# Patient Record
Sex: Male | Born: 1955
Health system: Southern US, Community
[De-identification: ages and names within clinical notes are randomized; demographics above are authoritative.]

## PROBLEM LIST (undated history)

## (undated) DIAGNOSIS — Z87442 Personal history of urinary calculi: Secondary | ICD-10-CM

## (undated) DIAGNOSIS — R001 Bradycardia, unspecified: Secondary | ICD-10-CM

## (undated) DIAGNOSIS — N200 Calculus of kidney: Secondary | ICD-10-CM

## (undated) DIAGNOSIS — F419 Anxiety disorder, unspecified: Secondary | ICD-10-CM

## (undated) DIAGNOSIS — G43909 Migraine, unspecified, not intractable, without status migrainosus: Secondary | ICD-10-CM

## (undated) DIAGNOSIS — R030 Elevated blood-pressure reading, without diagnosis of hypertension: Secondary | ICD-10-CM

## (undated) DIAGNOSIS — I10 Essential (primary) hypertension: Secondary | ICD-10-CM

## (undated) HISTORY — DX: Calculus of kidney: N20.0

## (undated) HISTORY — DX: Migraine, unspecified, not intractable, without status migrainosus: G43.909

## (undated) HISTORY — DX: Elevated blood-pressure reading, without diagnosis of hypertension: R03.0

## (undated) HISTORY — DX: Bradycardia, unspecified: R00.1

## (undated) HISTORY — DX: Anxiety disorder, unspecified: F41.9

---

## 1990-05-12 DIAGNOSIS — N200 Calculus of kidney: Secondary | ICD-10-CM

## 1990-05-12 HISTORY — DX: Calculus of kidney: N20.0

## 2000-12-14 ENCOUNTER — Emergency Department (HOSPITAL_COMMUNITY): Admission: EM | Admit: 2000-12-14 | Discharge: 2000-12-14 | Payer: Self-pay | Admitting: Emergency Medicine

## 2002-05-08 ENCOUNTER — Encounter: Payer: Self-pay | Admitting: Emergency Medicine

## 2002-05-09 ENCOUNTER — Inpatient Hospital Stay (HOSPITAL_COMMUNITY): Admission: EM | Admit: 2002-05-09 | Discharge: 2002-05-11 | Payer: Self-pay | Admitting: Emergency Medicine

## 2002-05-10 ENCOUNTER — Encounter: Payer: Self-pay | Admitting: Urology

## 2002-05-11 ENCOUNTER — Encounter: Payer: Self-pay | Admitting: Urology

## 2002-05-12 HISTORY — PX: URETEROSCOPY: SHX842

## 2004-05-12 HISTORY — PX: COLONOSCOPY: SHX174

## 2005-04-15 ENCOUNTER — Ambulatory Visit: Payer: Self-pay | Admitting: Internal Medicine

## 2005-04-18 ENCOUNTER — Ambulatory Visit (HOSPITAL_COMMUNITY): Admission: RE | Admit: 2005-04-18 | Discharge: 2005-04-18 | Payer: Self-pay | Admitting: Internal Medicine

## 2005-04-23 ENCOUNTER — Ambulatory Visit (HOSPITAL_COMMUNITY): Admission: RE | Admit: 2005-04-23 | Discharge: 2005-04-23 | Payer: Self-pay | Admitting: Family Medicine

## 2005-04-28 ENCOUNTER — Encounter (HOSPITAL_COMMUNITY): Admission: RE | Admit: 2005-04-28 | Discharge: 2005-05-06 | Payer: Self-pay | Admitting: Family Medicine

## 2005-05-13 ENCOUNTER — Encounter (HOSPITAL_COMMUNITY): Admission: RE | Admit: 2005-05-13 | Discharge: 2005-06-12 | Payer: Self-pay | Admitting: Family Medicine

## 2005-05-16 ENCOUNTER — Ambulatory Visit (HOSPITAL_COMMUNITY): Admission: RE | Admit: 2005-05-16 | Discharge: 2005-05-16 | Payer: Self-pay | Admitting: Family Medicine

## 2005-05-19 ENCOUNTER — Ambulatory Visit (HOSPITAL_COMMUNITY): Admission: RE | Admit: 2005-05-19 | Discharge: 2005-05-20 | Payer: Self-pay | Admitting: Family Medicine

## 2005-05-21 ENCOUNTER — Ambulatory Visit: Payer: Self-pay | Admitting: *Deleted

## 2005-07-12 ENCOUNTER — Emergency Department (HOSPITAL_COMMUNITY): Admission: EM | Admit: 2005-07-12 | Discharge: 2005-07-12 | Payer: Self-pay | Admitting: Emergency Medicine

## 2006-11-02 ENCOUNTER — Emergency Department (HOSPITAL_COMMUNITY): Admission: EM | Admit: 2006-11-02 | Discharge: 2006-11-02 | Payer: Self-pay | Admitting: Emergency Medicine

## 2010-05-12 DIAGNOSIS — R030 Elevated blood-pressure reading, without diagnosis of hypertension: Secondary | ICD-10-CM

## 2010-05-12 DIAGNOSIS — R001 Bradycardia, unspecified: Secondary | ICD-10-CM

## 2010-05-12 HISTORY — DX: Elevated blood-pressure reading, without diagnosis of hypertension: R03.0

## 2010-05-12 HISTORY — DX: Bradycardia, unspecified: R00.1

## 2010-09-27 NOTE — Op Note (Signed)
NAME:  Shaun Potter, Shaun Potter           ACCOUNT NO.:  [id number]   MEDICAL RECORD NO.:  [id number]          PATIENT TYPE:  AMB   LOCATION:  DAY                           FACILITY:  [facility]   PHYSICIAN:  R. Roetta Sessions, M.D. DATE OF BIRTH:  07-Nov-1955   DATE OF PROCEDURE:  04/18/2005  DATE OF DISCHARGE:                                 OPERATIVE REPORT   PROCEDURE:  Screening colonoscopy.   INDICATIONS FOR PROCEDURE:  The patient is a 55 year old gentleman sent over  at the courtesy of Dr. Milinda Cave for colorectal cancer screening. He is devoid  of any lower GI tract symptoms. No family history of colorectal neoplasia.  He has never had his lower GI tract imaged. Colonoscopy is now being done as  a screening maneuver. This approach has been discussed with the patient at  length. Potential risks, benefits, and alternatives have been reviewed and  questions answered. He is agreeable. Please see documentation in the medical  record.   PROCEDURE NOTE:  O2 saturation, blood pressure, pulse, and respirations were  monitored throughout the entire procedure. Conscious sedation with Versed 3  mg IV and Demerol 25 mg IV in divided doses.   INSTRUMENT:  Olympus video chip system.   FINDINGS:  Digital rectal exam revealed no abnormalities.   ENDOSCOPIC FINDINGS:  Prep was adequate.   Rectum:  Examination of the rectal mucosa including retroflexed view of the  anal verge revealed anal papilla and internal hemorrhoids. Otherwise rectal  mucosa appeared normal.   Colon:  Colonic mucosa was surveyed from the rectosigmoid junction through  the left, transverse, and right colon to the area of the appendiceal  orifice, ileocecal valve, and cecum. These structures were well seen and  photographed for the record. From this level, the scope was slowly  withdrawn, and all previously mentioned mucosal surfaces were again seen.  The colonic mucosa appeared entirely normal. The patient tolerated the  procedure  well and was reactive to endoscopy.   IMPRESSION:  1.  Anal papilla and internal hemorrhoids. Otherwise normal rectum.  2.  Normal colon.   RECOMMENDATIONS:  Repeat colonoscopy in 10 years.      Jonathon Bellows, M.D.  Electronically Signed     RMR/MEDQ  D:  04/18/2005  T:  04/18/2005  Job:  [id number]   cc:   Jeoffrey Massed, MD  Fax: [phone]

## 2010-09-27 NOTE — Op Note (Signed)
NAME:  JACKIE, LITTLEJOHN NO.:  [id number]   MEDICAL RECORD NO.:  [id number]                   PATIENT TYPE:  INP   LOCATION:  A311                                 FACILITY:  [facility]   PHYSICIAN:  Dennie Maizes, M.D.                DATE OF BIRTH:  [birth year]   DATE OF PROCEDURE:  05/11/2002  DATE OF DISCHARGE:                                 OPERATIVE REPORT   PREOPERATIVE DIAGNOSES:  Left distal ureteral calculus with obstruction,  left hydronephrosis, left renal colic.   POSTOPERATIVE DIAGNOSES:  Left distal ureteral calculus with obstruction,  left hydronephrosis, left renal colic.   PROCEDURE:  Cystoscopy, left retrograde pyelogram, ureteroscopy, stone  extraction and left ureteral stent placement.   ANESTHESIA:  General.   SURGEON:  Dennie Maizes, M.D.   COMPLICATIONS:  None.   INDICATIONS FOR PROCEDURE:  This 55 year old male was admitted to the  hospital with severe left renal colic. Evaluation revealed a 5 mm size left  distal ureteral calculus with obstruction and hydronephrosis. The patient  had persistent pain and he was unable to pass the stone. He was taken to the  operating room today for cystoscopy, left retrograde pyelogram,  ureteroscopy, stone extraction and ureteral  stent placement.   DESCRIPTION OF PROCEDURE:  General anesthesia was induced and the patient  was placed on the OR table in the dorsal lithotomy position. The lower  abdomen and genitalia were prepped and draped in a sterile fashion.  Cystoscopy was done with a 25 [place] scope. The urethra and prostate were  normal. The bladder was then examined. There was _______ edema and erythema  around the left ureteral orifice at the prominence of the entire middle  ureter. There was distortion and medial displacement of the ureteral  orifice. A 5 French _________ catheter could not be inserted into the  ureteral orifice. I was unable to pass a Benson guidewire into the  ureter.  Through a 5 [place] open ended catheter, an angled zebra guidewire was then  inserted with some difficulty into the renal pelvis. The distal ureter was  then dilated using an 31 French balloon dilating catheter. The balloon  dilating catheter was then removed leaving the guidewire in place. The 5  [place] open ended catheter was then placed over the guidewire and the  contrast was injected to see the collecting system. The [facility] guidewire was  then inserted through the open ended catheter and the open ended catheter  was removed.   Ureteroscopy was done with a rigid 9.5 French ureteroscope. During this  procedure, a 5 mm stone fell out of the distal ureter into the bladder. This  was irrigated and taken out. Examination of the ureter was done up to the  pelvic  brim and no other stone was seen. The instruments were then removed. A 6  French 26 cm size stent with a string was then inserted into the left  collecting system. The patient tolerated the procedure well. He was  transferred to the PACU in a satisfactory condition.                                               Dennie Maizes, M.D.    SK/MEDQ  D:  05/11/2002  T:  05/11/2002  Job:  [id number]

## 2010-09-27 NOTE — Discharge Summary (Signed)
NAME:  Shaun Potter, Shaun Potter NO.:  [id number]   MEDICAL RECORD NO.:  [id number]                   PATIENT TYPE:  INP   LOCATION:  A311                                 FACILITY:  [facility]   PHYSICIAN:  Dennie Maizes, M.D.                DATE OF BIRTH:  Jul 17, 1955   DATE OF ADMISSION:  05/08/2002  DATE OF DISCHARGE:  05/11/2002                                 DISCHARGE SUMMARY   FINAL DIAGNOSES:  1. Left distal ureteral calculus with obstruction.  2. Left hydronephrosis.  3. Left renal colic.  4. Hypokalemia.   OPERATIVE PROCEDURE:  Cystoscopy, left retrograde pyelogram, ureteroscopy,  stone extraction and left ureteral stent placement done on May 11, 2002.   COMPLICATIONS:  None.   DISCHARGE SUMMARY:  This 55 year old male has a past history of  urolithiasis.  He has passed a stone in 10-14-1990.  Experienced sudden onset of  severe left flank pain radiating to the front since 6 p.m. on May 07, 2002.  He came to emergency room at [facility] for further  evaluation.  Microscopic hematuria was noted.  He was evaluated with a  noncontrast spiral CT scan of the abdomen and pelvis.  This revealed a 4.5  mm sized left ureteral calculus at the level of the uterosacral junction  with high grade obstruction.  The patient's pain was not adequately relieved  in the emergency room.  He was admitted to the hospital for pain control, IV  fluids, further evaluation, and treatment.   PAST MEDICAL HISTORY:  Urolithiasis in 101.  No medical illnesses.   MEDICATIONS:  None.   OPERATIONS:  None.   ALLERGIES:  None.   FAMILY HISTORY:  Positive for urolithiasis.   PHYSICAL EXAMINATION:  GENERAL:  The patient was comfortable after receiving  PCA analgesia.  HEENT:  Normal.  NECK:  No masses.  LUNGS:  Clear to auscultation.  HEART:  Regular rate and rhythm.  No murmurs.  ABDOMEN:  Soft.  No palpable flank mass.  Mild upper costovertebral angle  tenderness was noted.  Bladder not palpable.  GENITOURINARY:  Penis normal.  Testes normal.  RECTAL:  30 g sized benign prostate.   ADMISSION LABORATORIES:  Urinalysis:  Large blood, 2+ ketones.  CBC:  WBC  8.1, hemoglobin 13.6.  Sodium 138, potassium 3.3, BUN 22, creatinine 1.3.   HOSPITAL COURSE:  The patient was admitted to the hospital and treated with  IV fluids and potassium supplements.  He received parenteral narcotics.  He  continued to have severe intermittent left flank pain.  An x-ray of the area  done on May 10, 2002, revealed a 5 mm sized left distal ureteral  calculus.  The patient was unable to pass the stone.  After consulting he  was taken to the OR on May 11, 2002.  Cystoscopy, left retrograde  pyelogram, ureteroscopy, stone extraction, and left ureteral stent placement  were  done.  The patient had adequate pain relief after this.  He was  discharged home in the postoperative period.   DISCHARGE MEDICATIONS:  1. Amoxicillin 500 mg one p.o. t.i.d. for seven days.  2. Percocet 5/325 one p.o. q.8h. p.r.n. pain number 20.   FOLLOW UP:  He will return to the office on May 16, 2002 for follow-up  and stent removal.  He was advised to call me for fever, chills, severe  pain, or voiding problems.                                               Dennie Maizes, M.D.    SK/MEDQ  D:  07/28/2002  T:  07/28/2002  Job:  [id number]

## 2010-09-27 NOTE — H&P (Signed)
NAME:  Shaun Potter, Shaun Potter NO.:  [id number]   MEDICAL RECORD NO.:  [id number]                   PATIENT TYPE:  INP   LOCATION:  A311                                 FACILITY:  [facility]   PHYSICIAN:  Dennie Maizes, M.D.                DATE OF BIRTH:  07-Sep-1955   DATE OF ADMISSION:  05/08/2002  DATE OF DISCHARGE:                                HISTORY & PHYSICAL   CHIEF COMPLAINT:  Severe left flank pain radiating to the front.   HISTORY OF PRESENT ILLNESS:  This 55 year old male has a past history of  urolithiasis.  He has passed a kidney stone in 1990/10/18.  He experienced sudden  onset of severe left flank pain radiating to the front since 6 p.m.  yesterday.  He came to the emergency room for further evaluation.  Microscopic hematuria was noted.  He was evaluated with a noncontrast spiral  CT scan of the abdomen and pelvis.  This revealed a 4.5 mm sized left  vesicoureteral calculus at the level of the ureterovesicular junction with  high grade obstruction.  The patient's pain was not adequately relieved in  the emergency room.  He has been admitted to the hospital for pain control,  IV fluids, further evaluation and treatment.   PAST MEDICAL HISTORY:  Urolithiasis in 35.  No medical illnesses.   MEDICATIONS:  None.   PAST SURGICAL HISTORY:  None.   ALLERGIES:  None.   FAMILY HISTORY:  Positive for urolithiasis.   PHYSICAL EXAMINATION:  GENERAL:  The patient is comfortable with PCA pump.  HEENT:  Normal.  NECK:  No masses.  LUNGS:  Clear to auscultation.  HEART:  Regular rate and rhythm.  No murmurs.  ABDOMEN:  Soft.  No palpable flank mass.  Mild left CVA tenderness is noted.  Bladder not palpable.  GENITOURINARY:  Penis normal.  Testes normal.  RECTAL:  ___________ gram benign prostate.   ADMISSION LABORATORIES:  Urinalysis:  Large blood, 2+ ketones.  CBC:  WBC  8.1, hemoglobin 13.6.  Sodium 138, potassium 3.3.  BUN 22, creatinine 1.3.   IMPRESSION:  1. Left distal ureteral calculus with obstruction.  2. Left hydronephrosis.  3. Left renal colic.  4. Hypokalemia.   PLAN:  1. Admit patient to the hospital.  2. PCA for analgesia.  3. Strain all urine for stones.  4. IV fluids.  5. Discussed with the patient regarding management options.  If he has     persistent pain and if he is unable to pass the stone, will do     cystoscopy, left retrograde pyelogram, and ureteroscopy stone extraction.                                               Dennie Maizes, M.D.    SK/MEDQ  D:  05/09/2002  T:  05/09/2002  Job:  [id number]   cc:   Mila Homer. Sudie Bailey, M.D.  [address] [place], [place] [address]  Fax: [phone]

## 2010-11-05 ENCOUNTER — Observation Stay (HOSPITAL_COMMUNITY)
Admission: EM | Admit: 2010-11-05 | Discharge: 2010-11-06 | Disposition: A | Discharge status: Home / Self Care | Payer: BC Managed Care – PPO | Attending: Internal Medicine | Admitting: Internal Medicine

## 2010-11-05 ENCOUNTER — Emergency Department (HOSPITAL_COMMUNITY): Payer: BC Managed Care – PPO

## 2010-11-05 DIAGNOSIS — R002 Palpitations: Secondary | ICD-10-CM

## 2010-11-05 DIAGNOSIS — R0789 Other chest pain: Principal | ICD-10-CM | POA: Insufficient documentation

## 2010-11-05 DIAGNOSIS — N289 Disorder of kidney and ureter, unspecified: Secondary | ICD-10-CM | POA: Insufficient documentation

## 2010-11-05 DIAGNOSIS — R0602 Shortness of breath: Secondary | ICD-10-CM

## 2010-11-05 DIAGNOSIS — R030 Elevated blood-pressure reading, without diagnosis of hypertension: Secondary | ICD-10-CM | POA: Insufficient documentation

## 2010-11-05 LAB — CK TOTAL AND CKMB (NOT AT ARMC)
CK, MB: 3.9 ng/mL (ref 0.3–4.0)
Total CK: 138 U/L (ref 7–232)

## 2010-11-05 LAB — DIFFERENTIAL
Basophils Absolute: 0 10*3/uL (ref 0.0–0.1)
Neutrophils Relative %: 44 % (ref 43–77)

## 2010-11-05 LAB — BASIC METABOLIC PANEL
Calcium: 9.1 mg/dL (ref 8.4–10.5)
GFR calc Af Amer: >60 mL/min (ref >60)

## 2010-11-05 LAB — CARDIAC PANEL(CRET KIN+CKTOT+MB+TROPI)
CK, MB: 3.5 ng/mL (ref 0.3–4.0)
Relative Index: 2.7 — ABNORMAL HIGH (ref 0.0–2.5)
Total CK: 122 U/L (ref 7–232)
Troponin I: <0.3 ng/mL (ref <0.30)

## 2010-11-05 LAB — CBC
Hemoglobin: 14.5 g/dL (ref 13.0–17.0)
MCH: 31 pg (ref 26.0–34.0)
Platelets: 179 10*3/uL (ref 150–400)
RDW: 12.5 % (ref 11.5–15.5)
WBC: 4.8 10*3/uL (ref 4.0–10.5)

## 2010-11-05 LAB — TROPONIN I: Troponin I: <0.3 ng/mL (ref <0.30)

## 2010-11-05 NOTE — H&P (Signed)
NAME:  Shaun Potter, Shaun Potter NO.:  [id number]  MEDICAL RECORD NO.:  [id number]  LOCATION:  APED                          FACILITY:  [facility]  PHYSICIAN:  Wilson Singer, M.D.DATE OF BIRTH:  Apr 06, 1956  DATE OF ADMISSION:  11/05/2010 DATE OF DISCHARGE:  LH                             HISTORY & PHYSICAL   PRIMARY CARE PHYSICIAN:  Francoise Schaumann. Halm, DO, FAAP  CHIEF COMPLAINT:  Chest pain.  HISTORY OF PRESENT ILLNESS:  This is a very pleasant 56 year old man describes approximately two-day history of intermittent chest tightness lasting approximately 2 minutes every time associated with tingling of his fingers and around his mouth.  He also feels he has to take a deep breath from time-to-time.  Exercise such as going upstairs actually improves his symptoms.  He was worried about his heart and therefore came to the emergency room upon which he was referred to [facility] for admission in view of the concern over cardiac chest pain.  The patient has no family history of early coronary artery disease, no history of hypertension, no history of diabetes, and no history of hyperlipidemia.  He is a nonsmoker.  PAST MEDICAL HISTORY:  Kidney stone removal and also migraine for which he takes Maxalt p.r.n.  SOCIAL HISTORY:  He has been married for 21 years.  He does not smoke, does not drink alcohol.  He works as an Lexicographer.  MEDICATIONS:  Maxalt 10 mg p.r.n. for migraine.  ALLERGIES:  None.  REVIEW OF SYSTEMS:  Apart from the symptoms mentioned above, there are no other symptoms referable to all systems reviewed.  He did tell me that he has been worried for the last month about a car that he is having restored and this car is in [place].  He keeps thinking about this most days and he is concerned that the car is not being restored in timely manner.  Everyday that the car is in the garage in [place], he has to pay 50 dollars per day.  PHYSICAL  EXAMINATION:  VITAL SIGNS:  Temperature 97.7, blood pressure 131/92, pulse 64, saturation 98% on room air. GENERAL:  He looks systemically well and does not look in acute pain. He is not clubbed.  There is no jaundice.  There is no clinical anemia. CARDIOVASCULAR:  Heart sounds present and normal. LUNGS:  Lung fields are entirely clear. ABDOMEN:  Soft and nontender with no evidence of hepatosplenomegaly. NEUROLOGIC:  He is alert and oriented without any focal neurologic signs. SKIN:  There are no obvious skin lesions or rashes.  INVESTIGATIONS:  Electrocardiogram shows normal sinus rhythm without acute ST-T wave changes and essentially is a normal electrocardiogram. Lab work shows a hemoglobin of 14.5, white blood cell count 4.8, platelets 179.  Sodium 140, potassium 3.9, bicarbonate 27, BUN 16, creatinine 1.37.  Initial cardiac enzymes negative.  Chest x-ray in no active disease.  PROBLEM LIST: 1. Chest pain/tightness, possibly related to hyperventilation and     anxiety. 2. Hyperventilation/anxiety.  PLAN:  1,  Admit to telemetry. 1. Serial cardiac enzymes and ECGs. 2. Cardiology consultation with a view to a stress test.  Further recommendations will depend on the patient's hospital progress.  Wilson Singer, M.D.     NCG/MEDQ  D:  11/05/2010  T:  11/05/2010  Job:  [id number]  cc:   Francoise Schaumann. Raynelle Highland Fax: [phone]  Electronically Signed by Lilly Cove M.D. on 11/05/2010 02:25:40 PM

## 2010-11-06 DIAGNOSIS — R072 Precordial pain: Secondary | ICD-10-CM

## 2010-11-06 LAB — CBC
MCHC: 33.9 g/dL (ref 30.0–36.0)
MCV: 91.3 fL (ref 78.0–100.0)
WBC: 5 10*3/uL (ref 4.0–10.5)

## 2010-11-06 LAB — COMPREHENSIVE METABOLIC PANEL
ALT: 12 U/L (ref 0–53)
BUN: 15 mg/dL (ref 6–23)
Calcium: 9 mg/dL (ref 8.4–10.5)
Creatinine, Ser: 1.48 mg/dL — ABNORMAL HIGH (ref 0.50–1.35)
GFR calc Af Amer: 59 mL/min — ABNORMAL LOW (ref >60)
GFR calc non Af Amer: 49 mL/min — ABNORMAL LOW (ref >60)
Potassium: 3.8 mEq/L (ref 3.5–5.1)
Total Bilirubin: 0.3 mg/dL (ref 0.3–1.2)

## 2010-11-06 LAB — DIFFERENTIAL
Basophils Absolute: 0 10*3/uL (ref 0.0–0.1)
Basophils Relative: 1 % (ref 0–1)
Eosinophils Relative: 3 % (ref 0–5)
Lymphocytes Relative: 39 % (ref 12–46)
Lymphs Abs: 2 10*3/uL (ref 0.7–4.0)
Monocytes Absolute: 0.3 10*3/uL (ref 0.1–1.0)
Neutro Abs: 2.5 10*3/uL (ref 1.7–7.7)

## 2010-11-12 NOTE — Consult Note (Signed)
NAME:  MACKIE, HOLNESS NO.:  [id number]  MEDICAL RECORD NO.:  [id number]  LOCATION:  A305                          FACILITY:  [facility]  PHYSICIAN:  Jesse Sans. Yaeli Hartung, MD, FACCDATE OF BIRTH:  Jun 06, 1955  DATE OF CONSULTATION: DATE OF DISCHARGE:                                CONSULTATION   I was asked by Dr. Karilyn Cota of the [facility] to consult on Kaidan Harpster with some irregular heartbeat and some shortness of breath and palpitations and chest discomfort.  HISTORY OF PRESENT ILLNESS:  Mr. Jethro Poling is a very active, healthy 55-year-old married white male who was admitted this morning with the above complaint.  He was playing golf at [employer] on Friday.  He drank 3 large cups of water and then felt a discomfort in his chest.  He then checked his pulse with his wrist monitor which was irregular.  He continued to have the irregularity that evening.  He felt bad the next day and decided to come home.  He took it easy over the weekend.  On Monday he felt pretty good.  This morning at work he felt a bit anxious and felt some tingling around his mouth and the ends of his fingers.  He fell little short of breath.  He was admitted to the emergency room.  His initial EKG remarkably showed sinus bradycardia rate 55 with no acute changes.  First set of markers are negative.  His cardiac risk factors are minimal being age only.  He has noted a slight increase in his blood pressure over the last 3 weeks and he was hypertensive in the ED.  PAST MEDICAL HISTORY:  He has a history of migraine headaches and takes Maxalt frequently.  In addition, he has this questionable new-onset hypertension.  There is no history of hyperlipidemia.  No diabetes.  He does not smoke, does not drink.  MEDICATIONS AT HOME: 1. Advil 400 mg as needed. 2. Maxalt 10 mg as needed.  ALLERGIES:  He has no known drug allergies.  PAST SURGICAL HISTORY:  Colonoscopy.  Looking  back through his records, he did have a Holter monitor put on in January 2007 which showed a slow heart rate while asleep but no ventricular ectopy, no atrial fib, no other dysrhythmias.  SOCIAL HISTORY:  Lives in [place].  He is married.  He has no children.  He is an Psychologist, forensic.  He enjoys multiple sports including basketball and softball.  He stays in good shape.  He has had no exertional symptoms by the way.  FAMILY HISTORY:  Remarkable for his father who has irregular heartbeat which has "never been diagnosed"  There is no premature coronary artery disease in his family.  REVIEW OF SYSTEMS:  Other than HPI is all negative.  PHYSICAL EXAMINATION:  GENERAL:  He is remarkably healthy-looking gentleman in no acute distress. VITAL SIGNS:  Blood pressure 149/90, his pulse is 53 and regular, his respirations are 18, his temperature is 97.6, sats 96% on room air. HEENT:  Symmetrical facies.  PERRLA.  Extraocular movements intact. Sclerae are clear.  Dentition satisfactory. NECK:  Supple.  No carotid bruits.  Thyroid is not enlarged.  Trachea is midline.  No adenopathy. LUNGS:  Clear to auscultation and percussion HEART:  A nondisplaced PMI.  Slow regular rate and rhythm.  S2 splits. No murmurs, no click. ABDOMEN:  Soft, good bowel sounds.  No midline bruits. EXTREMITIES:  No cyanosis, clubbing or edema.  Pulses are intact. NEURO:  Intact. PSYCHIATRIC:  Normal affect. SKIN:  Unremarkable.  LABORATORY DATA:  Chest x-ray and EKG reviewed.  ASSESSMENT:  Noncoronary chest pain.  I suspect this was cold water- induced esophageal spasm, perhaps associated with a feeling of some irregularity which he notes on a regular basis though it has never been found to be of any substantial objectively.  He also had some hyperventilation today with some anxiety.  He clearly has some new onset hypertension.  RECOMMENDATIONS: 1. Check cardiac enzymes.  If negative, no further  cardiac workup. 2. A 2-D echocardiogram to rule out any organic heart disease.  If it     is normal, it will make him feel a lot better. 3. Decrease caffeine 4. Increase exercise to reduce his stress. 5. I would treat his hypertension with an ARB or ACE generic with and     without diuretic.  He has a significant     resting bradycardia and gets very bradycardic with sleep.  I would     avoid a beta-blocker or a calcium channel blocker.  Thank you very much for the consultation.     Kareem Cathey C. Daleen Squibb, MD, [facility]     TCW/MEDQ  D:  11/05/2010  T:  11/06/2010  Job:  [id number]  Electronically Signed by Valera Castle MD [facility] on 11/12/2010 10:36:28 AM

## 2010-11-14 NOTE — Discharge Summary (Signed)
  NAME:  Shaun Potter, Shaun Potter NO.:  [id number]  MEDICAL RECORD NO.:  [id number]  LOCATION:  A305                          FACILITY:  [facility]  PHYSICIAN:  Creedon Danielski L. Lendell Potter, MDDATE OF BIRTH:  10/11/1955  DATE OF ADMISSION:  11/05/2010 DATE OF DISCHARGE:  06/27/2012LH                              DISCHARGE SUMMARY   DISCHARGE DIAGNOSES: 1. Chest pain, myocardial infarction ruled out. 2. Elevated blood pressure without history of documented hypertension. 3. History of migraines. 4. Renal insufficiency.  DISCHARGE MEDICATIONS: 1. Tylenol 650 mg p.o. q.4 h. p.r.n. pain. 2. Maxalt 10 mg as needed for migraine. 3. Ibuprofen 400 mg p.o. q.6 h. p.r.n. pain.  CONDITION:  Stable.  ACTIVITY:  Ad lib.  DIET:  Low salt.  FOLLOWUP:  With Dr. Milford Cage for blood pressure check and monitor creatinine.  CONSULTATIONS:  [facility].  PROCEDURES:  None.  LABORATORY DATA:  Serial cardiac enzymes negative.  CBC normal.  Basic metabolic panel significant for a creatinine of 1.37.  Chest x-ray showed nothing acute.  Echocardiogram showed normal ejection fraction, grade 1 diastolic dysfunction, no wall motion abnormalities.  EKG showed normal sinus rhythm.  HISTORY AND HOSPITAL COURSE:  Please see H and P for complete admission details.  Shaun Potter is a pleasant, active 55 year old white male, who presented with chest discomfort and palpitations.  It occurred after drinking liquids.  He had a normal EKG and cardiac enzymes, but was placed on observation on the Hospitalist Service.  Cardiology was consulted and recommended an echocardiogram, but no further workup.  His echocardiogram shows good ejection fraction and normal wall motion.  He has been cleared for discharge.  If his symptoms continue, Dr. Diona Browner recommends an outpatient stress echocardiogram, but no further workup at this time.  The patient has had mildly elevated blood pressures ranging 120-150/80- 90.   He has a blood pressure monitor at home and reports that he usually runs about 125-130/74 or so.  He also had a bit of renal insufficiency. He can follow up with his primary care physician to monitor blood pressure and renal function.     Shaun Thurman L. Lendell Caprice, MD     CLS/MEDQ  D:  11/06/2010  T:  11/07/2010  Job:  [id number]  cc:   Francoise Schaumann. Benjamine Mola, [place] Fax: [phone]  Electronically Signed by Crista Curb MD on 11/14/2010 07:15:51 PM

## 2011-02-26 LAB — CBC
MCV: 84.7
RBC: 4.74

## 2011-02-26 LAB — COMPREHENSIVE METABOLIC PANEL
AST: 25
BUN: 22
CO2: 23
Calcium: 9.7
Chloride: 108
Creatinine, Ser: 1.44
Glucose, Bld: 124 — ABNORMAL HIGH
Potassium: 3.6
Sodium: 140
Total Protein: 7.2

## 2011-02-26 LAB — DIFFERENTIAL
Basophils Absolute: 0
Basophils Relative: 0
Eosinophils Absolute: 0
Eosinophils Relative: 0
Lymphocytes Relative: 10 — ABNORMAL LOW
Monocytes Absolute: 0.4
Monocytes Relative: 6

## 2011-02-26 LAB — URINALYSIS, ROUTINE W REFLEX MICROSCOPIC
Glucose, UA: NEGATIVE
Nitrite: NEGATIVE
Protein, ur: NEGATIVE

## 2011-02-26 LAB — LIPASE, BLOOD: Lipase: 15

## 2011-05-27 ENCOUNTER — Ambulatory Visit: Payer: BC Managed Care – PPO | Admitting: Cardiology

## 2011-05-28 ENCOUNTER — Ambulatory Visit: Payer: BC Managed Care – PPO | Admitting: Cardiology

## 2011-06-06 ENCOUNTER — Encounter: Payer: Self-pay | Admitting: Cardiology

## 2011-06-06 ENCOUNTER — Ambulatory Visit: Payer: BC Managed Care – PPO | Admitting: Cardiology

## 2011-06-06 DIAGNOSIS — R079 Chest pain, unspecified: Secondary | ICD-10-CM | POA: Insufficient documentation

## 2011-06-06 DIAGNOSIS — N200 Calculus of kidney: Secondary | ICD-10-CM | POA: Insufficient documentation

## 2011-06-06 DIAGNOSIS — G43909 Migraine, unspecified, not intractable, without status migrainosus: Secondary | ICD-10-CM | POA: Insufficient documentation

## 2012-06-04 IMAGING — CR DG CHEST 1V PORT
1 series · 1 of 1 positions shown · non-contrast
Comparison: 07/12/2005

CLINICAL DATA: Chest pain and short of breath.  Palpitations.

PORTABLE CHEST - 1 VIEW

[view not recorded]
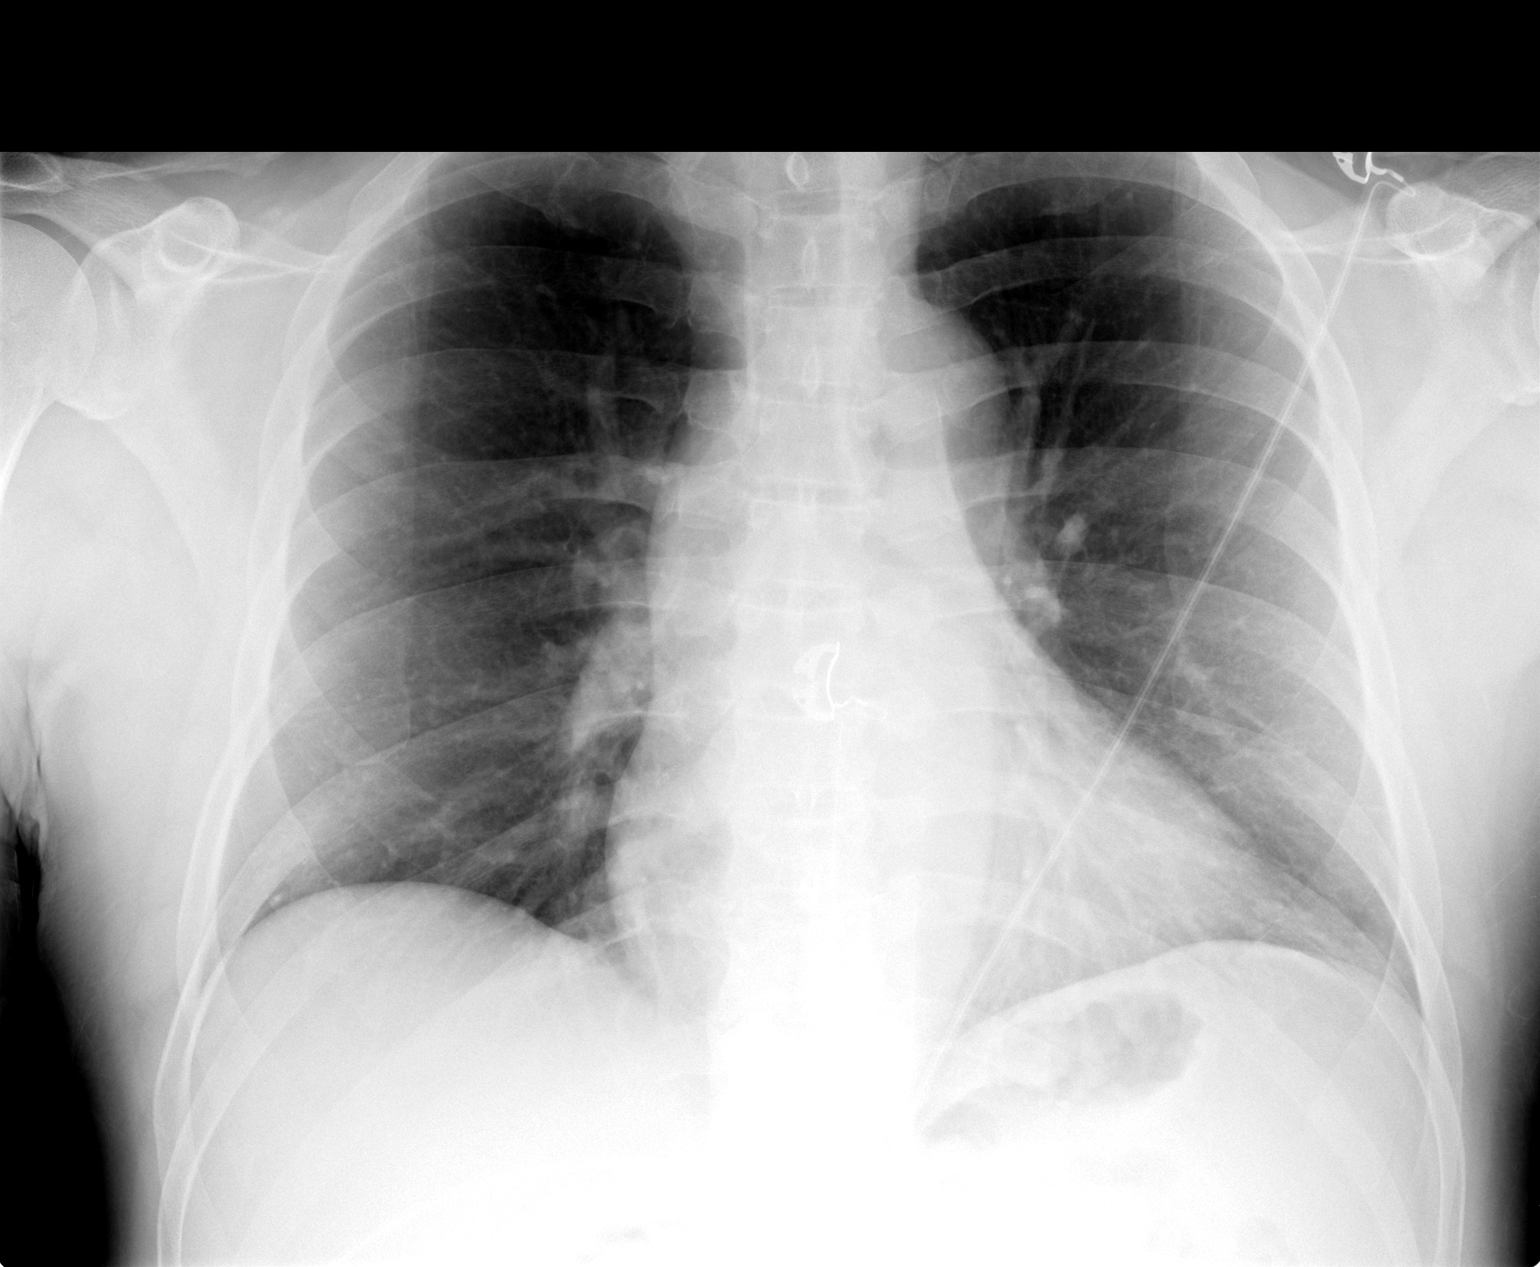

[1 of 1 positions shown; findings below may reference images not displayed]

FINDINGS: Heart size is within normal limits.  Both lungs are
clear.  No evidence of pleural effusion.  No mass or adenopathy
identified.
IMPRESSION: No active disease.

## 2012-10-29 ENCOUNTER — Encounter (HOSPITAL_COMMUNITY): Payer: Self-pay | Admitting: Pharmacy Technician

## 2012-11-03 NOTE — Patient Instructions (Addendum)
Shaun Potter  11/03/2012   Your procedure is scheduled on:  11/10/2012  Report to [facility] at  615  AM.  Call this number if you have problems the morning of surgery: [phone]   Remember:   Do not eat food or drink liquids after midnight.   Take these medicines the morning of surgery with A SIP OF WATER: benicar   Do not wear jewelry, make-up or nail polish.  Do not wear lotions, powders, or perfumes.   Do not shave 48 hours prior to surgery. Men may shave face and neck.  Do not bring valuables to the hospital.  [facility] is not responsible for any belongings or valuables.  Contacts, dentures or bridgework may not be worn into surgery.  Leave suitcase in the car. After surgery it may be brought to your room.  For patients admitted to the hospital, checkout time is 11:00 AM the day of discharge.   Patients discharged the day of surgery will not be allowed to drive home.  Name and phone number of your driver: family  Special Instructions: Shower using CHG 2 nights before surgery and the night before surgery.  If you shower the day of surgery use CHG.  Use special wash - you have one bottle of CHG for all showers.  You should use approximately 1/3 of the bottle for each shower.   Please read over the following fact sheets that you were given: Pain Booklet, Coughing and Deep Breathing, MRSA Information and Surgical Site Infection Prevention Cyst Removal Your caregiver has removed a cyst. A cyst is a sac containing a semi-solid material. Cysts may occur any place on your body. They may remain small for years or gradually get larger. A sebaceous cyst is an enlarged (dilated) sweat gland filled with old sweat (sebum). Unattended, these may become large (the size of a softball) over several years time. These are often removed for improved appearance (cosmetic) reasons or before they become infected to form an abscess. An abscess is an infected cyst. HOME CARE INSTRUCTIONS   Keep  your bandage clean and dry. You may change your bandage after 24 hours. If your bandage sticks, use warm water to gently loosen it. Pat the area dry with a clean towel before putting on another bandage.  If possible, keep the area where the cyst was removed raised to relieve soreness, swelling, and promote healing.  If you have stitches, keep them clean and dry.  You may clean your stitches gently with a cotton swab dipped in warm soapy water.  Do not soak the area where the cyst was removed or go swimming. You may shower.  Do not overuse the area where your cyst was removed.  Return in 7 days or as directed to have your stitches removed.  Take medicines as instructed by your caregiver. SEEK IMMEDIATE MEDICAL CARE IF:   An oral temperature above 102 F (38.9 C) develops, not controlled by medication.  Blood continues to soak through the bandage.  You have increasing pain in the area where your cyst was removed.  You have redness, swelling, pus, a bad smell, soreness (inflammation), or red streaks coming away from the stitches. These are signs of infection. MAKE SURE YOU:   Understand these instructions.  Will watch your condition.  Will get help right away if you are not doing well or get worse. Document Released: 04/25/2000 Document Revised: 07/21/2011 Document Reviewed: 08/19/2007 [facility] Patient Information 2014 [place], [place]. PATIENT  INSTRUCTIONS POST-ANESTHESIA  IMMEDIATELY FOLLOWING SURGERY:  Do not drive or operate machinery for the first twenty four hours after surgery.  Do not make any important decisions for twenty four hours after surgery or while taking narcotic pain medications or sedatives.  If you develop intractable nausea and vomiting or a severe headache please notify your doctor immediately.  FOLLOW-UP:  Please make an appointment with your surgeon as instructed. You do not need to follow up with anesthesia unless specifically instructed to do so.  WOUND  CARE INSTRUCTIONS (if applicable):  Keep a dry clean dressing on the anesthesia/puncture wound site if there is drainage.  Once the wound has quit draining you may leave it open to air.  Generally you should leave the bandage intact for twenty four hours unless there is drainage.  If the epidural site drains for more than 36-48 hours please call the anesthesia department.  QUESTIONS?:  Please feel free to call your physician or the hospital operator if you have any questions, and they will be happy to assist you.

## 2012-11-04 ENCOUNTER — Encounter (HOSPITAL_COMMUNITY): Payer: Self-pay

## 2012-11-04 ENCOUNTER — Other Ambulatory Visit: Payer: Self-pay

## 2012-11-04 ENCOUNTER — Encounter (HOSPITAL_COMMUNITY)
Admission: RE | Admit: 2012-11-04 | Discharge: 2012-11-04 | Disposition: A | Discharge status: Home / Self Care | Payer: Managed Care, Other (non HMO) | Source: Ambulatory Visit | Attending: General Surgery | Admitting: General Surgery

## 2012-11-04 HISTORY — DX: Personal history of urinary calculi: Z87.442

## 2012-11-04 HISTORY — DX: Essential (primary) hypertension: I10

## 2012-11-04 LAB — CBC WITH DIFFERENTIAL/PLATELET
Basophils Absolute: 0 10*3/uL (ref 0.0–0.1)
Basophils Relative: 1 % (ref 0–1)
MCHC: 33.4 g/dL (ref 30.0–36.0)
Neutro Abs: 2.8 10*3/uL (ref 1.7–7.7)
Neutrophils Relative %: 58 % (ref 43–77)
RDW: 12.7 % (ref 11.5–15.5)

## 2012-11-04 LAB — BASIC METABOLIC PANEL
BUN: 15 mg/dL (ref 6–23)
Calcium: 9.6 mg/dL (ref 8.4–10.5)
Creatinine, Ser: 1.28 mg/dL (ref 0.50–1.35)
GFR calc Af Amer: 70 mL/min — ABNORMAL LOW (ref >90)
GFR calc non Af Amer: 60 mL/min — ABNORMAL LOW (ref >90)
Potassium: 3.9 mEq/L (ref 3.5–5.1)

## 2012-11-04 LAB — SURGICAL PCR SCREEN: MRSA, PCR: NEGATIVE

## 2012-11-10 ENCOUNTER — Encounter (HOSPITAL_COMMUNITY)
Admission: RE | Disposition: A | Discharge status: Home / Self Care | Payer: Self-pay | Source: Ambulatory Visit | Attending: General Surgery

## 2012-11-10 ENCOUNTER — Encounter (HOSPITAL_COMMUNITY): Payer: Self-pay | Admitting: *Deleted

## 2012-11-10 ENCOUNTER — Ambulatory Visit (HOSPITAL_COMMUNITY): Payer: Managed Care, Other (non HMO) | Admitting: Anesthesiology

## 2012-11-10 ENCOUNTER — Encounter (HOSPITAL_COMMUNITY): Payer: Self-pay | Admitting: Anesthesiology

## 2012-11-10 ENCOUNTER — Ambulatory Visit (HOSPITAL_COMMUNITY)
Admission: RE | Admit: 2012-11-10 | Discharge: 2012-11-10 | Disposition: A | Discharge status: Home / Self Care | Payer: Managed Care, Other (non HMO) | Source: Ambulatory Visit | Attending: General Surgery | Admitting: General Surgery

## 2012-11-10 DIAGNOSIS — I10 Essential (primary) hypertension: Secondary | ICD-10-CM | POA: Insufficient documentation

## 2012-11-10 DIAGNOSIS — Z79899 Other long term (current) drug therapy: Secondary | ICD-10-CM | POA: Insufficient documentation

## 2012-11-10 DIAGNOSIS — L723 Sebaceous cyst: Secondary | ICD-10-CM

## 2012-11-10 HISTORY — PX: EAR CYST EXCISION: SHX22

## 2012-11-10 SURGERY — CYST REMOVAL
Anesthesia: General | Site: Thigh | Laterality: Right | Wound class: Clean

## 2012-11-10 MED ORDER — CEFAZOLIN SODIUM-DEXTROSE 2-3 GM-% IV SOLR
2.0000 g | INTRAVENOUS | Status: AC
  Administered 2012-11-10: 2 g via INTRAVENOUS

## 2012-11-10 MED ORDER — FENTANYL CITRATE 0.05 MG/ML IJ SOLN
INTRAMUSCULAR | Status: AC
  Filled 2012-11-10: qty 2

## 2012-11-10 MED ORDER — MIDAZOLAM HCL 2 MG/2ML IJ SOLN
INTRAMUSCULAR | Status: AC
  Filled 2012-11-10: qty 2

## 2012-11-10 MED ORDER — HYDROCODONE-ACETAMINOPHEN 5-325 MG PO TABS: 1.0000 | ORAL_TABLET | ORAL | Status: DC | PRN

## 2012-11-10 MED ORDER — ENOXAPARIN SODIUM 40 MG/0.4ML ~~LOC~~ SOLN
40.0000 mg | Freq: Once | SUBCUTANEOUS | Status: AC
  Administered 2012-11-10: 40 mg via SUBCUTANEOUS

## 2012-11-10 MED ORDER — BUPIVACAINE HCL (PF) 0.5 % IJ SOLN
INTRAMUSCULAR | Status: DC | PRN
  Administered 2012-11-10: 10 mL

## 2012-11-10 MED ORDER — ONDANSETRON HCL 4 MG/2ML IJ SOLN
INTRAMUSCULAR | Status: AC
  Filled 2012-11-10: qty 2

## 2012-11-10 MED ORDER — CEFAZOLIN SODIUM-DEXTROSE 2-3 GM-% IV SOLR
INTRAVENOUS | Status: AC
  Filled 2012-11-10: qty 50

## 2012-11-10 MED ORDER — SODIUM CHLORIDE 0.9 % IR SOLN
Status: DC | PRN
  Administered 2012-11-10: 1000 mL

## 2012-11-10 MED ORDER — CELECOXIB 100 MG PO CAPS: 400.0000 mg | ORAL_CAPSULE | Freq: Every day | ORAL | Status: DC

## 2012-11-10 MED ORDER — FENTANYL CITRATE 0.05 MG/ML IJ SOLN: 25.0000 ug | INTRAMUSCULAR | Status: DC | PRN

## 2012-11-10 MED ORDER — PROPOFOL 10 MG/ML IV BOLUS
INTRAVENOUS | Status: DC | PRN
  Administered 2012-11-10: 150 mg via INTRAVENOUS

## 2012-11-10 MED ORDER — LIDOCAINE HCL (PF) 1 % IJ SOLN
INTRAMUSCULAR | Status: AC
  Filled 2012-11-10: qty 5

## 2012-11-10 MED ORDER — PROPOFOL 10 MG/ML IV EMUL
INTRAVENOUS | Status: AC
  Filled 2012-11-10: qty 20

## 2012-11-10 MED ORDER — LACTATED RINGERS IV SOLN
INTRAVENOUS | Status: DC
  Administered 2012-11-10: 07:00:00 via INTRAVENOUS

## 2012-11-10 MED ORDER — ONDANSETRON HCL 4 MG/2ML IJ SOLN: 4.0000 mg | Freq: Once | INTRAMUSCULAR | Status: DC | PRN

## 2012-11-10 MED ORDER — FENTANYL CITRATE 0.05 MG/ML IJ SOLN
25.0000 ug | INTRAMUSCULAR | Status: DC | PRN
  Administered 2012-11-10: 25 ug via INTRAVENOUS

## 2012-11-10 MED ORDER — LIDOCAINE HCL 1 % IJ SOLN
INTRAMUSCULAR | Status: DC | PRN
  Administered 2012-11-10: 50 mg via INTRADERMAL

## 2012-11-10 MED ORDER — MIDAZOLAM HCL 2 MG/2ML IJ SOLN
1.0000 mg | INTRAMUSCULAR | Status: DC | PRN
  Administered 2012-11-10: 2 mg via INTRAVENOUS

## 2012-11-10 MED ORDER — ENOXAPARIN SODIUM 40 MG/0.4ML ~~LOC~~ SOLN
SUBCUTANEOUS | Status: AC
  Filled 2012-11-10: qty 0.4

## 2012-11-10 MED ORDER — ONDANSETRON HCL 4 MG/2ML IJ SOLN
4.0000 mg | Freq: Once | INTRAMUSCULAR | Status: AC
  Administered 2012-11-10: 4 mg via INTRAVENOUS

## 2012-11-10 MED ORDER — FENTANYL CITRATE 0.05 MG/ML IJ SOLN
INTRAMUSCULAR | Status: DC | PRN
  Administered 2012-11-10: 25 ug via INTRAVENOUS

## 2012-11-10 MED ORDER — MIDAZOLAM HCL 5 MG/5ML IJ SOLN
INTRAMUSCULAR | Status: DC | PRN
  Administered 2012-11-10: 2 mg via INTRAVENOUS

## 2012-11-10 MED ORDER — BUPIVACAINE HCL (PF) 0.5 % IJ SOLN
INTRAMUSCULAR | Status: AC
  Filled 2012-11-10: qty 30

## 2012-11-10 SURGICAL SUPPLY — 27 items
APL SKNCLS STERI-STRIP NONHPOA (GAUZE/BANDAGES/DRESSINGS) ×1
BAG HAMPER (MISCELLANEOUS) ×2 IMPLANT
BENZOIN TINCTURE PRP APPL 2/3 (GAUZE/BANDAGES/DRESSINGS) ×2 IMPLANT
CLOTH BEACON ORANGE TIMEOUT ST (SAFETY) ×2 IMPLANT
COVER LIGHT HANDLE STERIS (MISCELLANEOUS) ×4 IMPLANT
DURAPREP 26ML APPLICATOR (WOUND CARE) ×2 IMPLANT
ELECT REM PT RETURN 9FT ADLT (ELECTROSURGICAL) ×2
ELECTRODE REM PT RTRN 9FT ADLT (ELECTROSURGICAL) ×1 IMPLANT
FORMALIN 10 PREFIL 120ML (MISCELLANEOUS) ×2 IMPLANT
GLOVE BIOGEL PI IND STRL 7.5 (GLOVE) ×1 IMPLANT
GLOVE BIOGEL PI INDICATOR 7.5 (GLOVE) ×1
GLOVE ECLIPSE 6.5 STRL STRAW (GLOVE) ×2 IMPLANT
GLOVE ECLIPSE 7.0 STRL STRAW (GLOVE) ×2 IMPLANT
GLOVE INDICATOR 7.0 STRL GRN (GLOVE) ×3 IMPLANT
GOWN STRL REIN XL XLG (GOWN DISPOSABLE) ×5 IMPLANT
KIT ROOM TURNOVER APOR (KITS) ×2 IMPLANT
MANIFOLD NEPTUNE II (INSTRUMENTS) ×2 IMPLANT
NS IRRIG 1000ML POUR BTL (IV SOLUTION) ×2 IMPLANT
PACK MINOR (CUSTOM PROCEDURE TRAY) ×2 IMPLANT
PAD ARMBOARD 7.5X6 YLW CONV (MISCELLANEOUS) ×2 IMPLANT
SET BASIN LINEN APH (SET/KITS/TRAYS/PACK) ×2 IMPLANT
STRIP CLOSURE SKIN 1/2X4 (GAUZE/BANDAGES/DRESSINGS) ×2 IMPLANT
SUT MNCRL AB 4-0 PS2 18 (SUTURE) ×2 IMPLANT
SUT VIC AB 3-0 SH 27 (SUTURE) ×2
SUT VIC AB 3-0 SH 27X BRD (SUTURE) IMPLANT
SYR BULB IRRIGATION 50ML (SYRINGE) ×2 IMPLANT
SYR CONTROL 10ML LL (SYRINGE) ×2 IMPLANT

## 2012-11-10 NOTE — H&P (Signed)
  NTS SOAP Note  Vital Signs:  Vitals as of: 10/07/2012: Systolic 144: Diastolic 81: Heart Rate 58: Temp 98.16F: Height 58ft 2in: Weight 224Lbs 0 Ounces: BMI 28.76  BMI : 28.76 kg/m2  Subjective: This 76 Years 22 Months old Male presents for of nodule on right upper knee.  Present for years.  Slowly increasing in size.  Now causing symptoms.  NO discharge.  No similar lesions in the past.  Review of Symptoms:  Constitutional:unremarkable   Head:unremarkable    Eyes:unremarkable  negative Nose/Mouth/Throat:unremarkable Cardiovascular:  unremarkable   Respiratory:unremarkable   Gastrointestinal:  unremarkable   Genitourinary:unremarkable     Musculoskeletal:unremarkable   as per HPI Breast:unremarkable   Hematolgic/Lymphatic:unremarkable     Allergic/Immunologic:unremarkable     Past Medical History:  Obtained     Past Medical History  Surgical History: none Medical Problems: HTN, migraines Psychiatric History: none Allergies: nkda Medications: Maxalt   Social History:Obtained  Social History  Preferred Language: English Race:  White Ethnicity: Not Hispanic / Latino Age: 57 Years 7 Months Marital Status:  M Alcohol: none Recreational drug(s): none   Smoking Status: Never smoker reviewed on 10/10/2012 Functional Status reviewed on mm/dd/yyyy ------------------------------------------------ Bathing: Normal Cooking: Normal Dressing: Normal Driving: Normal Eating: Normal Managing Meds: Normal Oral Care: Normal Shopping: Normal Toileting: Normal Transferring: Normal Walking: Normal Cognitive Status reviewed on mm/dd/yyyy ------------------------------------------------ Attention: Normal Decision Making: Normal Language: Normal Memory: Normal Motor: Normal Perception: Normal Problem Solving: Normal Visual and Spatial: Normal   Family History:Obtained    Family Health History Mother  Father  Other  Family Member, Living; Healthy; noncontributory    Objective Information: General:  Well appearing, well nourished in no distress. Skin:     no rash or prominent lesions except on distal anterior thigh:  1cm mobile, nontender nodule.   Head:Atraumatic; no masses; no abnormalities Eyes:  conjunctiva clear, EOM intact, PERRL Mouth:  Mucous membranes moist, no mucosal lesions. Neck:  Supple without lymphadenopathy.  Heart:  RRR, no murmur Lungs:    CTA bilaterally, no wheezes, rhonchi, rales.  Breathing unlabored. Abdomen:Soft, NT/ND, no HSM, no masses.  Assessment:    Plan: Secacceous cyst right distal thigh.  OPtions discussed with patient.  Patient will call to schedule.  Patient Education:Alternative treatments to surgery were discussed with patient (and family).  Risks and benefits  of procedure were fully explained to the patient (and family) who gave informed consent. Patient/family questions were addressed.  Follow-up:Pending Surgery

## 2012-11-10 NOTE — Transfer of Care (Signed)
Immediate Anesthesia Transfer of Care Note  Patient: Shaun Potter  Procedure(s) Performed: Procedure(s): EXCISION RIGHT THIGH CYST (Right)  Patient Location: PACU  Anesthesia Type:General  Level of Consciousness: awake, alert  and patient cooperative  Airway & Oxygen Therapy: Patient Spontanous Breathing and Patient connected to face mask oxygen  Post-op Assessment: Report given to PACU RN, Post -op Vital signs reviewed and stable and Patient moving all extremities  Post vital signs: Reviewed and stable  Complications: No apparent anesthesia complications

## 2012-11-10 NOTE — Interval H&P Note (Signed)
History and Physical Interval Note:  11/10/2012 7:38 AM  Shaun Potter  has presented today for surgery, with the diagnosis of sebaceous cyst right thigh  The various methods of treatment have been discussed with the patient and family. After consideration of risks, benefits and other options for treatment, the patient has consented to  Procedure(s): EXCISION SEBACEOUS CYST RIGHT THIGH (Right) as a surgical intervention .  The patient's history has been reviewed, patient examined, no change in status, stable for surgery.  I have reviewed the patient's chart and labs.  Questions were answered to the patient's satisfaction.     Kingston Guiles C

## 2012-11-10 NOTE — Op Note (Signed)
Patient:  Shaun Potter  DOB:  1955/08/04  MRN:  [id number]   Preop Diagnosis:  Right distal thigh cyst  Postop Diagnosis:  The same  Procedure:  Excision of right thigh cyst via 2 cm incision  Surgeon:  Dr. Tilford Pillar  Anes:  General via 1 mask airway, 0.5% Sensorcaine plain for local  Indications:  Patient is a 57 year old male presented my office a history of increasing nodule on his right anterior thigh/knee. This is been noted to have increasing discomfort. Risks benefits and alternatives of excision were discussed at length the patient including but not limited to risk of bleeding, infection, recurrence, skin dehiscence. His questions and concerns are addressed the patient as consented for the planned procedure.  Procedure note:  Patient is taken to the operating room was placed in supine position the or table time the general anesthetic is administered. Once patient was asleep a larger mask airway was placed by the nurse anesthetist. At this point his right knee is prepped with DuraPrep solution and draped in standard fashion. Time out was performed. An elliptical incision was created around his topical cyst with additional dissection down to subcuticular tissue carried out using electrocautery. The cyst is dissected circumferentially. Hemostasis is obtained with electrocautery. Once the cyst is free is placed in the back table sent as a perm specimen to pathology. At this point the wound is irrigated. The deep subcuticular tissues reapproximated using a 3-0 Vicryl in simple or fashion. The skin edges reapproximated using a 4-0 Monocryl in a running subcuticular suture. Skin was washed dried moist dry towel. Benzoin is applied around the incisions. Half-inch are suture placed. The drapes removed patient left come out of general anesthetic. She stretcher the PACU in stable condition. At the conclusion of procedure all angina, sponge, needle counts are correct. Patient tolerated the  procedure extremely well.  Complications:  None apparent  EBL:  Minimal  Specimen:  Cyst

## 2012-11-10 NOTE — Progress Notes (Signed)
Awake. Refuses po fluids. Denies pain. 

## 2012-11-10 NOTE — Anesthesia Procedure Notes (Signed)
Procedure Name: LMA Insertion Date/Time: 11/10/2012 7:50 AM Performed by: Despina Hidden Pre-anesthesia Checklist: Patient identified, Emergency Drugs available, Suction available and Patient being monitored Patient Re-evaluated:Patient Re-evaluated prior to inductionOxygen Delivery Method: Circle system utilized Preoxygenation: Pre-oxygenation with 100% oxygen Intubation Type: IV induction Ventilation: Mask ventilation without difficulty LMA Size: 4.0 Tube type: Oral Number of attempts: 1 Placement Confirmation: positive ETCO2 and breath sounds checked- equal and bilateral Tube secured with: Tape Dental Injury: Teeth and Oropharynx as per pre-operative assessment

## 2012-11-10 NOTE — Anesthesia Postprocedure Evaluation (Signed)
  Anesthesia Post-op Note  Patient: Shaun Potter  Procedure(s) Performed: Procedure(s): EXCISION RIGHT THIGH CYST (Right)  Patient Location: PACU  Anesthesia Type:General  Level of Consciousness: awake, alert , oriented and patient cooperative  Airway and Oxygen Therapy: Patient Spontanous Breathing  Post-op Pain: 2 /10, mild  Post-op Assessment: Post-op Vital signs reviewed, Patient's Cardiovascular Status Stable, Respiratory Function Stable, Patent Airway, No signs of Nausea or vomiting and Pain level controlled  Post-op Vital Signs: Reviewed and stable  Complications: No apparent anesthesia complications

## 2012-11-10 NOTE — Anesthesia Preprocedure Evaluation (Signed)
Anesthesia Evaluation  Patient identified by MRN, date of birth, ID band Patient awake    Reviewed: Allergy & Precautions, H&P , NPO status , Patient's Chart, lab work & pertinent test results  History of Anesthesia Complications Negative for: history of anesthetic complications  Airway Mallampati: II TM Distance: >3 FB     Dental  (+) Teeth Intact   Pulmonary neg pulmonary ROS, former smoker,  breath sounds clear to auscultation        Cardiovascular hypertension, Pt. on medications Rhythm:Regular Rate:Normal     Neuro/Psych  Headaches, Anxiety    GI/Hepatic   Endo/Other    Renal/GU Renal disease (stones)     Musculoskeletal   Abdominal   Peds  Hematology   Anesthesia Other Findings   Reproductive/Obstetrics                           Anesthesia Physical Anesthesia Plan  ASA: II  Anesthesia Plan: General   Post-op Pain Management:    Induction: Intravenous  Airway Management Planned: LMA  Additional Equipment:   Intra-op Plan:   Post-operative Plan: Extubation in OR  Informed Consent: I have reviewed the patients History and Physical, chart, labs and discussed the procedure including the risks, benefits and alternatives for the proposed anesthesia with the patient or authorized representative who has indicated his/her understanding and acceptance.     Plan Discussed with:   Anesthesia Plan Comments:         Anesthesia Quick Evaluation

## 2012-11-11 ENCOUNTER — Encounter (HOSPITAL_COMMUNITY): Payer: Self-pay | Admitting: General Surgery

## 2013-06-10 ENCOUNTER — Emergency Department (HOSPITAL_COMMUNITY)
Admission: EM | Admit: 2013-06-10 | Discharge: 2013-06-11 | Disposition: A | Discharge status: Home / Self Care | Payer: Managed Care, Other (non HMO) | Attending: Emergency Medicine | Admitting: Emergency Medicine

## 2013-06-10 ENCOUNTER — Emergency Department (HOSPITAL_COMMUNITY): Payer: Managed Care, Other (non HMO)

## 2013-06-10 ENCOUNTER — Encounter (HOSPITAL_COMMUNITY): Payer: Self-pay | Admitting: Emergency Medicine

## 2013-06-10 DIAGNOSIS — Z87891 Personal history of nicotine dependence: Secondary | ICD-10-CM | POA: Insufficient documentation

## 2013-06-10 DIAGNOSIS — Z79899 Other long term (current) drug therapy: Secondary | ICD-10-CM | POA: Insufficient documentation

## 2013-06-10 DIAGNOSIS — Z8659 Personal history of other mental and behavioral disorders: Secondary | ICD-10-CM | POA: Insufficient documentation

## 2013-06-10 DIAGNOSIS — N200 Calculus of kidney: Secondary | ICD-10-CM

## 2013-06-10 DIAGNOSIS — I10 Essential (primary) hypertension: Secondary | ICD-10-CM | POA: Insufficient documentation

## 2013-06-10 DIAGNOSIS — G43909 Migraine, unspecified, not intractable, without status migrainosus: Secondary | ICD-10-CM | POA: Insufficient documentation

## 2013-06-10 LAB — COMPREHENSIVE METABOLIC PANEL
ALK PHOS: 76 U/L (ref 39–117)
ALT: 21 U/L (ref 0–53)
AST: 26 U/L (ref 0–37)
Albumin: 4.1 g/dL (ref 3.5–5.2)
BUN: 22 mg/dL (ref 6–23)
CALCIUM: 9.1 mg/dL (ref 8.4–10.5)
CO2: 29 meq/L (ref 19–32)
Chloride: 100 mEq/L (ref 96–112)
Creatinine, Ser: 1.4 mg/dL — ABNORMAL HIGH (ref 0.50–1.35)
GFR calc Af Amer: 62 mL/min — ABNORMAL LOW (ref >90)
GFR calc non Af Amer: 54 mL/min — ABNORMAL LOW (ref >90)
GLUCOSE: 103 mg/dL — AB (ref 70–99)
POTASSIUM: 3.6 meq/L — AB (ref 3.7–5.3)
SODIUM: 142 meq/L (ref 137–147)
TOTAL PROTEIN: 7.5 g/dL (ref 6.0–8.3)
Total Bilirubin: 0.3 mg/dL (ref 0.3–1.2)

## 2013-06-10 LAB — CBC WITH DIFFERENTIAL/PLATELET
BASOS ABS: 0 10*3/uL (ref 0.0–0.1)
Basophils Relative: 1 % (ref 0–1)
EOS ABS: 0.2 10*3/uL (ref 0.0–0.7)
EOS PCT: 2 % (ref 0–5)
HCT: 41.6 % (ref 39.0–52.0)
Hemoglobin: 13.9 g/dL (ref 13.0–17.0)
LYMPHS ABS: 3.4 10*3/uL (ref 0.7–4.0)
LYMPHS PCT: 50 % — AB (ref 12–46)
MCH: 30.3 pg (ref 26.0–34.0)
MCHC: 33.4 g/dL (ref 30.0–36.0)
MCV: 90.8 fL (ref 78.0–100.0)
Monocytes Absolute: 0.6 10*3/uL (ref 0.1–1.0)
Monocytes Relative: 8 % (ref 3–12)
NEUTROS PCT: 39 % — AB (ref 43–77)
Neutro Abs: 2.6 10*3/uL (ref 1.7–7.7)
PLATELETS: 188 10*3/uL (ref 150–400)
RBC: 4.58 MIL/uL (ref 4.22–5.81)
RDW: 13.2 % (ref 11.5–15.5)
WBC: 6.8 10*3/uL (ref 4.0–10.5)

## 2013-06-10 LAB — URINALYSIS, ROUTINE W REFLEX MICROSCOPIC
BILIRUBIN URINE: NEGATIVE
Glucose, UA: NEGATIVE mg/dL
KETONES UR: NEGATIVE mg/dL
Leukocytes, UA: NEGATIVE
Nitrite: NEGATIVE
PH: 6 (ref 5.0–8.0)
Protein, ur: NEGATIVE mg/dL
SPECIFIC GRAVITY, URINE: 1.02 (ref 1.005–1.030)
Urobilinogen, UA: 0.2 mg/dL (ref 0.0–1.0)

## 2013-06-10 LAB — URINE MICROSCOPIC-ADD ON

## 2013-06-10 MED ORDER — HYDROMORPHONE HCL PF 1 MG/ML IJ SOLN
INTRAMUSCULAR | Status: AC
  Filled 2013-06-10: qty 1

## 2013-06-10 MED ORDER — ONDANSETRON 4 MG PO TBDP: ORAL_TABLET | ORAL | Status: DC

## 2013-06-10 MED ORDER — OXYCODONE-ACETAMINOPHEN 5-325 MG PO TABS: 1.0000 | ORAL_TABLET | ORAL | Status: DC | PRN

## 2013-06-10 MED ORDER — TAMSULOSIN HCL 0.4 MG PO CAPS: 0.4000 mg | ORAL_CAPSULE | Freq: Every day | ORAL | Status: DC

## 2013-06-10 MED ORDER — HYDROMORPHONE HCL PF 1 MG/ML IJ SOLN
1.0000 mg | Freq: Once | INTRAMUSCULAR | Status: AC
  Administered 2013-06-10: 1 mg via INTRAVENOUS

## 2013-06-10 MED ORDER — ONDANSETRON HCL 4 MG/2ML IJ SOLN
4.0000 mg | Freq: Once | INTRAMUSCULAR | Status: AC
  Administered 2013-06-10: 4 mg via INTRAVENOUS

## 2013-06-10 MED ORDER — ONDANSETRON HCL 4 MG/2ML IJ SOLN
INTRAMUSCULAR | Status: DC
Start: 2013-06-10 — End: 2013-06-11
  Filled 2013-06-10: qty 2

## 2013-06-10 MED ORDER — KETOROLAC TROMETHAMINE 30 MG/ML IJ SOLN
INTRAMUSCULAR | Status: DC
Start: 2013-06-10 — End: 2013-06-11
  Filled 2013-06-10: qty 1

## 2013-06-10 MED ORDER — KETOROLAC TROMETHAMINE 30 MG/ML IJ SOLN
30.0000 mg | Freq: Once | INTRAMUSCULAR | Status: AC
  Administered 2013-06-10: 30 mg via INTRAVENOUS

## 2013-06-10 MED ORDER — OXYCODONE-ACETAMINOPHEN 5-325 MG PO TABS: 1.0000 | ORAL_TABLET | Freq: Four times a day (QID) | ORAL | Status: DC | PRN

## 2013-06-10 NOTE — ED Notes (Signed)
PT C/O SEVERE RIGHT SIDED FLANK PAIN. PT HAS A HX OF KIDNEY STONES

## 2013-06-10 NOTE — Discharge Instructions (Signed)
Follow up with dr. Michela Pitcher next week

## 2013-06-10 NOTE — ED Provider Notes (Signed)
CSN: [id number]     Arrival date & time 06/10/13  2226 History  This chart was scribed for Maudry Diego, MD by Einar Pheasant, ED Scribe. This patient was seen in room APA09/APA09 and the patient's care was started at 10:43 PM.    Chief Complaint  Patient presents with  . Flank Pain    Patient is a 58 y.o. male presenting with flank pain. The history is provided by the patient. No language interpreter was used.  Flank Pain This is a new problem. The current episode started 1 to 2 hours ago. The problem occurs constantly. The problem has not changed since onset.Nothing aggravates the symptoms. Nothing relieves the symptoms. He has tried nothing for the symptoms.    HPI Comments: Shaun Potter is a 58 y.o. male with h/o kidney stones presents to the Emergency Department complaining of sudden onset right flank pain that started 1 hr ago. Pt states that he has had to episodes of kidney stones in the past. He denies any fever, chills, cough, or diaphoresis.   PCP: Dr Nevada Crane Past Medical History  Diagnosis Date  . Chest pain 2012    [facility] admission in 10/2010; associated with palpitations; negative Holter in 2007  . Nephrolithiasis 1992    s/p cystoscopic stone extraction  . Migraine headache   . Anxiety   . Borderline hypertension 2012  . Sinus bradycardia 2012  . History of kidney stones   . Hypertension    Past Surgical History  Procedure Laterality Date  . Colonoscopy  2006  . Ureteroscopy  2004    + stone extraction  . Ear cyst excision Right 11/10/2012    Procedure: EXCISION RIGHT THIGH CYST;  Surgeon: Donato Heinz, MD;  Location: [facility]S;  Service: General;  Laterality: Right;   Family History  Problem Relation Age of Onset  . Arrhythmia Father   . Coronary artery disease Neg Hx    History  Substance Use Topics  . Smoking status: Former Smoker -- 1.50 packs/day for 6 years    Types: Cigarettes    Quit date: 11/04/1992  . Smokeless tobacco: Former  Systems developer  . Alcohol Use: No    Review of Systems  Genitourinary: Positive for flank pain.  All other systems reviewed and are negative.    Allergies  Review of patient's allergies indicates no known allergies.  Home Medications   Current Outpatient Rx  Name  Route  Sig  Dispense  Refill  . alum hydroxide-mag trisilicate (ANTACID) 78-24 MG CHEW   Oral   Chew 1 tablet by mouth at bedtime.         Marland Kitchen HYDROcodone-acetaminophen (NORCO) 5-325 MG per tablet   Oral   Take 1-2 tablets by mouth every 4 (four) hours as needed for pain.   35 tablet   0   . olmesartan (BENICAR) 20 MG tablet   Oral   Take 20 mg by mouth daily.          Triage Vitals:BP 151/88  Pulse 53  Temp(Src) 97.6 F (36.4 C)  Resp 24  Ht 6\' 2"  (1.88 m)  Wt 225 lb (102.059 kg)  BMI 28.88 kg/m2  SpO2 100%  Physical Exam  Constitutional: He is oriented to person, place, and time. He appears well-developed.  HENT:  Head: Normocephalic.  Eyes: Conjunctivae and EOM are normal. No scleral icterus.  Neck: Neck supple. No thyromegaly present.  Cardiovascular: Normal rate and regular rhythm.  Exam reveals no gallop and  no friction rub.   No murmur heard. Pulmonary/Chest: No stridor. He has no wheezes. He has no rales. He exhibits no tenderness.  Abdominal: He exhibits no distension. There is no tenderness. There is CVA tenderness (moderate to severe right sided). There is no rebound.  Musculoskeletal: Normal range of motion. He exhibits no edema.  Lymphadenopathy:    He has no cervical adenopathy.  Neurological: He is oriented to person, place, and time. He exhibits normal muscle tone. Coordination normal.  Skin: No rash noted. No erythema.  Psychiatric: He has a normal mood and affect. His behavior is normal.    ED Course  Procedures (including critical care time)  DIAGNOSTIC STUDIES: Oxygen Saturation is 100% on RA, normal by my interpretation.    COORDINATION OF CARE: 10:47 PM- Will order UA. Will  also prescribe pain medication. Pt advised of plan for treatment and pt agrees.  Medications  ketorolac (TORADOL) 30 MG/ML injection 30 mg (not administered)  HYDROmorphone (DILAUDID) injection 1 mg (not administered)  ondansetron (ZOFRAN) injection 4 mg (not administered)  ketorolac (TORADOL) 30 MG/ML injection (not administered)  HYDROmorphone (DILAUDID) 1 MG/ML injection (not administered)  ondansetron (ZOFRAN) 4 MG/2ML injection (not administered)    Labs Review Labs Reviewed  CBC WITH DIFFERENTIAL  COMPREHENSIVE METABOLIC PANEL  URINALYSIS, ROUTINE W REFLEX MICROSCOPIC   Imaging Review No results found.  EKG Interpretation   None       MDM  Kidney stone    The chart was scribed for me under my direct supervision.  I personally performed the history, physical, and medical decision making and all procedures in the evaluation of this patient.Maudry Diego, MD 06/11/13 0000

## 2013-06-11 MED ORDER — OXYCODONE-ACETAMINOPHEN 5-325 MG PO TABS: 1.0000 | ORAL_TABLET | Freq: Four times a day (QID) | ORAL | Status: DC | PRN

## 2013-06-11 MED ORDER — PERCOCET 5-325 MG PO TABS: 1.0000 | ORAL_TABLET | Freq: Four times a day (QID) | ORAL | Status: DC | PRN

## 2013-06-11 NOTE — ED Notes (Signed)
PT SENT HOME WITH A STRAINER

## 2013-08-24 ENCOUNTER — Ambulatory Visit (INDEPENDENT_AMBULATORY_CARE_PROVIDER_SITE_OTHER): Payer: Self-pay | Admitting: *Deleted

## 2013-08-24 ENCOUNTER — Encounter: Payer: Self-pay | Admitting: *Deleted

## 2013-08-24 DIAGNOSIS — I781 Nevus, non-neoplastic: Secondary | ICD-10-CM

## 2013-08-24 NOTE — Progress Notes (Signed)
X=.3% Sotradecol administered with a 27g butterfly.  Patient received a total of 2cc.  One injection into a persistent spider vein that has had repeated treatments at another vein center. Easy access. Anticipate good results. Will follow prn Note: He was using TED stockings with the other vein center. I put him in 20-30 mm Hg thigh high compression as well as 4X4"s and a 4" ace wrap. Hopefully this will encourage the sider to stay closed.  Photos: yes  Compression stockings applied: yes

## 2014-01-02 ENCOUNTER — Telehealth: Payer: Self-pay

## 2014-01-02 NOTE — Telephone Encounter (Signed)
Pt was referred by Dr. Wende Neighbors for screening colonoscopy. Last doen 04/18/2005 and Dr. Gala Romney recommended the next one in 10 years which would be 04/2015.  Tried to call pt to see if he is having any GI problems or any new family hx of colon cancer.  Many rings and no answer.

## 2014-02-22 NOTE — Telephone Encounter (Signed)
LMOM ( pt identified himself on VM) that looks like he is not due TCS until 2016 unless he is having problems or family hx of colon cancer. LMOM for him to return call.

## 2014-12-02 ENCOUNTER — Ambulatory Visit (HOSPITAL_COMMUNITY)
Admission: RE | Admit: 2014-12-02 | Discharge: 2014-12-02 | Disposition: A | Discharge status: Home / Self Care | Payer: Managed Care, Other (non HMO) | Source: Ambulatory Visit | Attending: Internal Medicine | Admitting: Internal Medicine

## 2014-12-02 ENCOUNTER — Other Ambulatory Visit (HOSPITAL_COMMUNITY): Payer: Self-pay | Admitting: Internal Medicine

## 2014-12-02 DIAGNOSIS — R0789 Other chest pain: Secondary | ICD-10-CM | POA: Diagnosis not present

## 2015-03-27 ENCOUNTER — Telehealth: Payer: Self-pay | Admitting: Internal Medicine

## 2015-03-27 NOTE — Telephone Encounter (Signed)
On recall for tcs °

## 2015-03-27 NOTE — Telephone Encounter (Signed)
Letter mailed to pt.  

## 2015-04-03 ENCOUNTER — Telehealth: Payer: Self-pay

## 2015-04-03 NOTE — Telephone Encounter (Signed)
[phone] PATIENT RECEIVED LETTER TO SCHEDULE TCS  PLEASE CALL

## 2015-04-04 ENCOUNTER — Other Ambulatory Visit: Payer: Self-pay

## 2015-04-04 ENCOUNTER — Telehealth: Payer: Self-pay

## 2015-04-04 DIAGNOSIS — Z1211 Encounter for screening for malignant neoplasm of colon: Secondary | ICD-10-CM

## 2015-04-04 NOTE — Telephone Encounter (Signed)
See separate triage.  

## 2015-04-10 NOTE — Telephone Encounter (Signed)
Gastroenterology Pre-Procedure Review  Request Date:  04/04/2015 Requesting Physician: Dr. Wende Neighbors  PATIENT REVIEW QUESTIONS: The patient responded to the following health history questions as indicated:    Pt's last colonoscoy was 06/19/2004 by Dr. Gala Romney  1. Diabetes Melitis: no 2. Joint replacements in the past 12 months: no 3. Major health problems in the past 3 months: no 4. Has an artificial valve or MVP: no 5. Has a defibrillator: no 6. Has been advised in past to take antibiotics in advance of a procedure like teeth cleaning: no 7. Family history of colon cancer: no  8. Alcohol Use: no 9. History of sleep apnea: no     MEDICATIONS & ALLERGIES:    Patient reports the following regarding taking any blood thinners:   Plavix? no Aspirin? no Coumadin? no  Patient confirms/reports the following medications:  Current Outpatient Prescriptions  Medication Sig Dispense Refill  . alum hydroxide-mag trisilicate (ANTACID) [id number] MG CHEW Chew 1 tablet by mouth at bedtime.    Marland Kitchen HYDROcodone-acetaminophen (NORCO) 5-325 MG per tablet Take 1-2 tablets by mouth every 4 (four) hours as needed for pain. (Patient not taking: Reported on 04/04/2015) 35 tablet 0  . olmesartan (BENICAR) 20 MG tablet Take 20 mg by mouth daily.    . ondansetron (ZOFRAN ODT) 4 MG disintegrating tablet 4mg  ODT q4 hours prn nausea/vomit (Patient not taking: Reported on 04/04/2015) 12 tablet 0  . oxyCODONE-acetaminophen (PERCOCET) 5-325 MG per tablet Take 1 tablet by mouth every 6 (six) hours as needed. (Patient not taking: Reported on 04/04/2015) 6 tablet 0  . oxyCODONE-acetaminophen (PERCOCET/ROXICET) 5-325 MG per tablet Take 1 tablet by mouth every 4 (four) hours as needed for severe pain. (Patient not taking: Reported on 04/04/2015) 6 tablet 0  . oxyCODONE-acetaminophen (PERCOCET/ROXICET) 5-325 MG per tablet Take 1 tablet by mouth every 6 (six) hours as needed for severe pain. (Patient not taking: Reported on 04/04/2015)  30 tablet 0  . PERCOCET 5-325 MG per tablet Take 1 tablet by mouth every 6 (six) hours as needed. (Patient not taking: Reported on 04/04/2015) 20 tablet 0  . tamsulosin (FLOMAX) 0.4 MG CAPS capsule Take 1 capsule (0.4 mg total) by mouth daily. (Patient not taking: Reported on 04/04/2015) 5 capsule 0   No current facility-administered medications for this visit.    Patient confirms/reports the following allergies:  No Known Allergies  No orders of the defined types were placed in this encounter.    AUTHORIZATION INFORMATION Primary Insurance:   ID #:   Group #:  Pre-Cert / Auth required:  Pre-Cert / Auth #:   Secondary Insurance:   ID #:  Group #:  Pre-Cert / Auth required:  Pre-Cert / Auth #:   SCHEDULE INFORMATION: Procedure has been scheduled as follows:  Date:  05/04/2015              Time:   Location: [facility] Short Stay  This Gastroenterology Pre-Precedure Review Form is being routed to the following provider(s): R. Garfield Cornea, MD

## 2015-04-10 NOTE — Telephone Encounter (Signed)
Patient will need OV to review meds and consider propofol due to polypharmacy.

## 2015-04-11 NOTE — Telephone Encounter (Signed)
Pt is aware and OV appt with Neil Crouch, PA on 04/27/2015 at 8:00 Am.  Shaun Potter is aware in Endo to cancel appt on 05/04/2015.

## 2015-04-27 ENCOUNTER — Ambulatory Visit (INDEPENDENT_AMBULATORY_CARE_PROVIDER_SITE_OTHER): Payer: Self-pay | Admitting: Gastroenterology

## 2015-04-27 ENCOUNTER — Encounter: Payer: Self-pay | Admitting: Gastroenterology

## 2015-04-27 ENCOUNTER — Other Ambulatory Visit: Payer: Self-pay

## 2015-04-27 VITALS — BP 163/90 | HR 56 | Temp 97.6°F | Ht 74.0 in | Wt 220.0 lb

## 2015-04-27 DIAGNOSIS — Z1211 Encounter for screening for malignant neoplasm of colon: Secondary | ICD-10-CM

## 2015-04-27 MED ORDER — PEG 3350-KCL-NA BICARB-NACL 420 G PO SOLR: 4000.0000 mL | Freq: Once | ORAL | Status: DC

## 2015-04-27 NOTE — Progress Notes (Signed)
Patient presented to schedule colonoscopy because of history of polypharmacy. In actuality patient has not been chronically on pain medication and provider inadvertently did not notice that he was not taking these medications. Briefly spoke with patient who denies any GI symptoms. Patient's office visit canceled today and he was not charged. Procedure scheduled prior to him leaving. Patient was very understanding.

## 2015-05-04 ENCOUNTER — Ambulatory Visit
Admission: RE | Admit: 2015-05-04 | Payer: Managed Care, Other (non HMO) | Source: Ambulatory Visit | Admitting: Internal Medicine

## 2015-05-04 ENCOUNTER — Encounter (HOSPITAL_COMMUNITY)
Admission: RE | Disposition: A | Discharge status: Home / Self Care | Payer: Self-pay | Source: Ambulatory Visit | Attending: Internal Medicine

## 2015-05-04 ENCOUNTER — Encounter: Admission: RE | Payer: Self-pay | Source: Ambulatory Visit

## 2015-05-04 ENCOUNTER — Ambulatory Visit (HOSPITAL_COMMUNITY)
Admission: RE | Admit: 2015-05-04 | Discharge: 2015-05-04 | Disposition: A | Discharge status: Home / Self Care | Payer: Managed Care, Other (non HMO) | Source: Ambulatory Visit | Attending: Internal Medicine | Admitting: Internal Medicine

## 2015-05-04 ENCOUNTER — Encounter (HOSPITAL_COMMUNITY): Payer: Self-pay | Admitting: *Deleted

## 2015-05-04 DIAGNOSIS — Z87891 Personal history of nicotine dependence: Secondary | ICD-10-CM | POA: Diagnosis not present

## 2015-05-04 DIAGNOSIS — Z1211 Encounter for screening for malignant neoplasm of colon: Secondary | ICD-10-CM | POA: Insufficient documentation

## 2015-05-04 DIAGNOSIS — F419 Anxiety disorder, unspecified: Secondary | ICD-10-CM | POA: Diagnosis not present

## 2015-05-04 DIAGNOSIS — Z79899 Other long term (current) drug therapy: Secondary | ICD-10-CM | POA: Diagnosis not present

## 2015-05-04 DIAGNOSIS — I10 Essential (primary) hypertension: Secondary | ICD-10-CM | POA: Diagnosis not present

## 2015-05-04 DIAGNOSIS — K648 Other hemorrhoids: Secondary | ICD-10-CM | POA: Diagnosis not present

## 2015-05-04 HISTORY — PX: COLONOSCOPY: SHX5424

## 2015-05-04 SURGERY — COLONOSCOPY
Anesthesia: Moderate Sedation

## 2015-05-04 MED ORDER — MIDAZOLAM HCL 5 MG/5ML IJ SOLN
INTRAMUSCULAR | Status: DC | PRN
  Administered 2015-05-04: 1 mg via INTRAVENOUS
  Administered 2015-05-04: 2 mg via INTRAVENOUS
  Administered 2015-05-04: 1 mg via INTRAVENOUS

## 2015-05-04 MED ORDER — ONDANSETRON HCL 4 MG/2ML IJ SOLN
INTRAMUSCULAR | Status: DC | PRN
  Administered 2015-05-04: 4 mg via INTRAVENOUS

## 2015-05-04 MED ORDER — MIDAZOLAM HCL 5 MG/5ML IJ SOLN
INTRAMUSCULAR | Status: AC
  Filled 2015-05-04: qty 10

## 2015-05-04 MED ORDER — ONDANSETRON HCL 4 MG/2ML IJ SOLN
INTRAMUSCULAR | Status: AC
  Filled 2015-05-04: qty 2

## 2015-05-04 MED ORDER — MEPERIDINE HCL 100 MG/ML IJ SOLN
INTRAMUSCULAR | Status: DC | PRN
  Administered 2015-05-04: 50 mg via INTRAVENOUS

## 2015-05-04 MED ORDER — MEPERIDINE HCL 100 MG/ML IJ SOLN
INTRAMUSCULAR | Status: AC
  Filled 2015-05-04: qty 2

## 2015-05-04 MED ORDER — STERILE WATER FOR IRRIGATION IR SOLN
Status: DC | PRN
  Administered 2015-05-04: 08:00:00

## 2015-05-04 MED ORDER — SODIUM CHLORIDE 0.9 % IV SOLN
INTRAVENOUS | Status: DC
  Administered 2015-05-04: 1000 mL via INTRAVENOUS

## 2015-05-04 NOTE — H&P (Signed)
@  [id number]   Primary Care Physician:  Wende Neighbors, MD Primary Gastroenterologist:  Dr. Gala Romney  Pre-Procedure History & Physical: HPI:  Shaun Potter is a 59 y.o. male is here for a screening colonoscopy. Average her screening examination. No bowel symptoms. No family history of colon cancer. Negative colonoscopy 10 years ago  Past Medical History  Diagnosis Date  . Chest pain 2012    [facility] admission in 10/2010; associated with palpitations; negative Holter in 2007  . Nephrolithiasis 1992    s/p cystoscopic stone extraction  . Migraine headache   . Anxiety   . Borderline hypertension 2012  . Sinus bradycardia 2012  . History of kidney stones   . Hypertension     Past Surgical History  Procedure Laterality Date  . Colonoscopy  2006  . Ureteroscopy  2004    + stone extraction  . Ear cyst excision Right 11/10/2012    Procedure: EXCISION RIGHT THIGH CYST;  Surgeon: Donato Heinz, MD;  Location: [facility];  Service: General;  Laterality: Right;    Prior to Admission medications   Medication Sig Start Date End Date Taking? Authorizing Provider  olmesartan (BENICAR) 20 MG tablet Take 20 mg by mouth daily.   Yes Historical Provider, MD  polyethylene glycol-electrolytes (NULYTELY/GOLYTELY) 420 G solution Take 4,000 mLs by mouth once. 04/27/15  Yes Mahala Menghini, PA-C    Allergies as of 04/27/2015  . (No Known Allergies)    Family History  Problem Relation Age of Onset  . Arrhythmia Father   . Coronary artery disease Neg Hx     Social History   Social History  . Marital Status: Married    Spouse Name: N/A  . Number of Children: 0  . Years of Education: N/A   Occupational History  . Pharmacist, hospital    Social History Main Topics  . Smoking status: Former Smoker -- 1.50 packs/day for 6 years    Types: Cigarettes    Quit date: 11/04/1992  . Smokeless tobacco: Former Systems developer  . Alcohol Use: No  . Drug Use: No  . Sexual Activity: Yes    Birth Control/  Protection: None   Other Topics Concern  . Not on file   Social History Narrative    Review of Systems: See HPI, otherwise negative ROS  Physical Exam: BP 148/95 mmHg  Pulse 55  Resp 7  SpO2 98% General:   Alert,  Well-developed, well-nourished, pleasant and cooperative in NAD Head:  Normocephalic and atraumatic. E Lungs:  Clear throughout to auscultation.   No wheezes, crackles, or rhonchi. No acute distress. Heart:  Regular rate and rhythm; no murmurs, clicks, rubs,  or gallops. Abdomen:  Soft, nontender and nondistended. No masses, hepatosplenomegaly or hernias noted. Normal bowel sounds, without guarding, and without rebound.   Msk:  Symmetrical without gross deformities. Normal posture. Pulses:  Normal pulses noted. Extremities:  Without clubbing or edema. Impression/Plan: Shaun Potter is now here to undergo a screening colonoscopy.  Average risk screening examination  Risks, benefits, limitations, imponderables and alternatives regarding colonoscopy have been reviewed with the patient. Questions have been answered. All parties agreeable.     Notice:  This dictation was prepared with Dragon dictation along with smaller phrase technology. Any transcriptional errors that result from this process are unintentional and may not be corrected upon review.

## 2015-05-04 NOTE — Op Note (Signed)
[facility] [address] [place], [address]   COLONOSCOPY PROCEDURE REPORT  PATIENT: Shaun Potter, Shaun Potter  MR#: [id number] BIRTHDATE: [birth year] , 59  yrs. old GENDER: male ENDOSCOPIST: R.  Garfield Cornea, MD FACP [facility] REFERRED EO:[id number] Hall, M.D. PROCEDURE DATE:  May 29, 2015 PROCEDURE:   Colonoscopy, screening INDICATIONS:Average risk colorectal cancer screening examination. MEDICATIONS: Versed 5 mg IV and Demerol 75 mg IV in divided doses. Zofran 4 mg IV. ASA CLASS:       Class II  CONSENT: The risks, benefits, alternatives and imponderables including but not limited to bleeding, perforation as well as the possibility of a missed lesion have been reviewed.  The potential for biopsy, lesion removal, etc. have also been discussed. Questions have been answered.  All parties agreeable.  Please see the history and physical in the medical record for more information.  DESCRIPTION OF PROCEDURE:   After the risks benefits and alternatives of the procedure were thoroughly explained, informed consent was obtained.  The digital rectal exam revealed no abnormalities of the rectum.   The [id number]  endoscope was introduced through the anus and advanced to the cecum, which was identified by both the appendix and ileocecal valve. No adverse events experienced.   The quality of the prep was adequate  The instrument was then slowly withdrawn as the colon was fully examined. Estimated blood loss is zero unless otherwise noted in this procedure report.      COLON FINDINGS: Internal hemorrhoids; otherwise, normal-appearing rectal mucosa.  Normal-appearing colonic mucosa.  Retroflexion was performed. .  Withdrawal time=8 minutes 0 seconds.  The scope was withdrawn and the procedure completed. COMPLICATIONS: There were no immediate complications.  ENDOSCOPIC IMPRESSION: Normal colonoscopy  RECOMMENDATIONS: repeat screening colonoscopy in 10 years  eSigned:   R. Garfield Cornea, MD Rosalita Chessman [facility] 05-29-15 8:34 AM   cc:  CPT CODES: ICD CODES:  The ICD and CPT codes recommended by this software are interpretations from the data that the clinical staff has captured with the software.  The verification of the translation of this report to the ICD and CPT codes and modifiers is the sole responsibility of the health care institution and practicing physician where this report was generated.  [employer] will not be held responsible for the validity of the ICD and CPT codes included on this report.  AMA assumes no liability for data contained or not contained herein. CPT is a Designer, television/film set of the [employer].

## 2015-05-04 NOTE — Discharge Instructions (Signed)
°  Colonoscopy Discharge Instructions  Read the instructions outlined below and refer to this sheet in the next few weeks. These discharge instructions provide you with general information on caring for yourself after you leave the hospital. Your doctor may also give you specific instructions. While your treatment has been planned according to the most current medical practices available, unavoidable complications occasionally occur. If you have any problems or questions after discharge, call Dr. Gala Romney at [phone]. ACTIVITY  You may resume your regular activity, but move at a slower pace for the next 24 hours.   Take frequent rest periods for the next 24 hours.   Walking will help get rid of the air and reduce the bloated feeling in your belly (abdomen).   No driving for 24 hours (because of the medicine (anesthesia) used during the test).    Do not sign any important legal documents or operate any machinery for 24 hours (because of the anesthesia used during the test).  NUTRITION  Drink plenty of fluids.   You may resume your normal diet as instructed by your doctor.   Begin with a light meal and progress to your normal diet. Heavy or fried foods are harder to digest and may make you feel sick to your stomach (nauseated).   Avoid alcoholic beverages for 24 hours or as instructed.  MEDICATIONS  You may resume your normal medications unless your doctor tells you otherwise.  WHAT YOU CAN EXPECT TODAY  Some feelings of bloating in the abdomen.   Passage of more gas than usual.   Spotting of blood in your stool or on the toilet paper.  IF YOU HAD POLYPS REMOVED DURING THE COLONOSCOPY:  No aspirin products for 7 days or as instructed.   No alcohol for 7 days or as instructed.   Eat a soft diet for the next 24 hours.  FINDING OUT THE RESULTS OF YOUR TEST Not all test results are available during your visit. If your test results are not back during the visit, make an appointment  with your caregiver to find out the results. Do not assume everything is normal if you have not heard from your caregiver or the medical facility. It is important for you to follow up on all of your test results.  SEEK IMMEDIATE MEDICAL ATTENTION IF:  You have more than a spotting of blood in your stool.   Your belly is swollen (abdominal distention).   You are nauseated or vomiting.   You have a temperature over 101.   You have abdominal pain or discomfort that is severe or gets worse throughout the day.   Your colonoscopy was normal today  I recommend you have your more screening colonoscopy in 10 years`

## 2015-05-10 ENCOUNTER — Encounter (HOSPITAL_COMMUNITY): Payer: Self-pay | Admitting: Internal Medicine

## 2017-12-02 ENCOUNTER — Other Ambulatory Visit (HOSPITAL_COMMUNITY): Payer: Self-pay | Admitting: Specialist

## 2017-12-02 DIAGNOSIS — R1319 Other dysphagia: Secondary | ICD-10-CM

## 2017-12-08 ENCOUNTER — Telehealth (HOSPITAL_COMMUNITY): Payer: Self-pay | Admitting: Internal Medicine

## 2017-12-08 NOTE — Telephone Encounter (Signed)
12/08/17  Patient cx because his insurance will not cover.  Wife called back to tell us.

## 2017-12-09 ENCOUNTER — Inpatient Hospital Stay (HOSPITAL_COMMUNITY): Admission: RE | Admit: 2017-12-09 | Payer: Managed Care, Other (non HMO) | Source: Ambulatory Visit

## 2017-12-09 ENCOUNTER — Telehealth (HOSPITAL_COMMUNITY): Payer: Self-pay | Admitting: Internal Medicine

## 2017-12-09 ENCOUNTER — Ambulatory Visit (HOSPITAL_COMMUNITY): Payer: Managed Care, Other (non HMO) | Admitting: Speech Pathology

## 2017-12-09 ENCOUNTER — Encounter (HOSPITAL_COMMUNITY): Payer: Self-pay

## 2017-12-09 ENCOUNTER — Other Ambulatory Visit (HOSPITAL_COMMUNITY): Payer: Managed Care, Other (non HMO)

## 2017-12-09 NOTE — Telephone Encounter (Signed)
12/09/17  pt called back and said that he was told by his insurance they won't pay for this so he has cancelled a 2nd time

## 2019-08-10 DIAGNOSIS — J029 Acute pharyngitis, unspecified: Secondary | ICD-10-CM | POA: Diagnosis not present

## 2019-08-10 DIAGNOSIS — R070 Pain in throat: Secondary | ICD-10-CM | POA: Diagnosis not present

## 2019-08-10 DIAGNOSIS — Z6828 Body mass index (BMI) 28.0-28.9, adult: Secondary | ICD-10-CM | POA: Diagnosis not present

## 2019-08-10 DIAGNOSIS — Z712 Person consulting for explanation of examination or test findings: Secondary | ICD-10-CM | POA: Diagnosis not present

## 2019-08-10 DIAGNOSIS — Z0000 Encounter for general adult medical examination without abnormal findings: Secondary | ICD-10-CM | POA: Diagnosis not present

## 2019-08-10 DIAGNOSIS — I10 Essential (primary) hypertension: Secondary | ICD-10-CM | POA: Diagnosis not present

## 2019-08-10 DIAGNOSIS — G43019 Migraine without aura, intractable, without status migrainosus: Secondary | ICD-10-CM | POA: Diagnosis not present

## 2019-08-10 DIAGNOSIS — Z23 Encounter for immunization: Secondary | ICD-10-CM | POA: Diagnosis not present

## 2019-08-10 DIAGNOSIS — R1310 Dysphagia, unspecified: Secondary | ICD-10-CM | POA: Diagnosis not present

## 2019-08-10 DIAGNOSIS — Z6829 Body mass index (BMI) 29.0-29.9, adult: Secondary | ICD-10-CM | POA: Diagnosis not present

## 2019-08-10 DIAGNOSIS — E663 Overweight: Secondary | ICD-10-CM | POA: Diagnosis not present

## 2019-08-10 DIAGNOSIS — E782 Mixed hyperlipidemia: Secondary | ICD-10-CM | POA: Diagnosis not present

## 2019-08-15 DIAGNOSIS — I10 Essential (primary) hypertension: Secondary | ICD-10-CM | POA: Diagnosis not present

## 2019-08-15 DIAGNOSIS — Z6829 Body mass index (BMI) 29.0-29.9, adult: Secondary | ICD-10-CM | POA: Diagnosis not present

## 2019-08-15 DIAGNOSIS — R944 Abnormal results of kidney function studies: Secondary | ICD-10-CM | POA: Diagnosis not present

## 2019-08-15 DIAGNOSIS — G43019 Migraine without aura, intractable, without status migrainosus: Secondary | ICD-10-CM | POA: Diagnosis not present

## 2019-08-15 DIAGNOSIS — E782 Mixed hyperlipidemia: Secondary | ICD-10-CM | POA: Diagnosis not present

## 2019-08-15 DIAGNOSIS — E663 Overweight: Secondary | ICD-10-CM | POA: Diagnosis not present

## 2019-08-15 DIAGNOSIS — R7301 Impaired fasting glucose: Secondary | ICD-10-CM | POA: Diagnosis not present

## 2019-08-15 DIAGNOSIS — Z6828 Body mass index (BMI) 28.0-28.9, adult: Secondary | ICD-10-CM | POA: Diagnosis not present

## 2019-08-15 DIAGNOSIS — Z0001 Encounter for general adult medical examination with abnormal findings: Secondary | ICD-10-CM | POA: Diagnosis not present

## 2020-02-15 DIAGNOSIS — E782 Mixed hyperlipidemia: Secondary | ICD-10-CM | POA: Diagnosis not present

## 2020-02-15 DIAGNOSIS — R944 Abnormal results of kidney function studies: Secondary | ICD-10-CM | POA: Diagnosis not present

## 2020-02-15 DIAGNOSIS — Z6828 Body mass index (BMI) 28.0-28.9, adult: Secondary | ICD-10-CM | POA: Diagnosis not present

## 2020-02-15 DIAGNOSIS — Z6829 Body mass index (BMI) 29.0-29.9, adult: Secondary | ICD-10-CM | POA: Diagnosis not present

## 2020-02-15 DIAGNOSIS — Z712 Person consulting for explanation of examination or test findings: Secondary | ICD-10-CM | POA: Diagnosis not present

## 2020-02-15 DIAGNOSIS — R1310 Dysphagia, unspecified: Secondary | ICD-10-CM | POA: Diagnosis not present

## 2020-02-15 DIAGNOSIS — I10 Essential (primary) hypertension: Secondary | ICD-10-CM | POA: Diagnosis not present

## 2020-02-15 DIAGNOSIS — R7301 Impaired fasting glucose: Secondary | ICD-10-CM | POA: Diagnosis not present

## 2020-02-15 DIAGNOSIS — E663 Overweight: Secondary | ICD-10-CM | POA: Diagnosis not present

## 2020-02-15 DIAGNOSIS — R070 Pain in throat: Secondary | ICD-10-CM | POA: Diagnosis not present

## 2020-02-15 DIAGNOSIS — Z0001 Encounter for general adult medical examination with abnormal findings: Secondary | ICD-10-CM | POA: Diagnosis not present

## 2020-02-15 DIAGNOSIS — Z0000 Encounter for general adult medical examination without abnormal findings: Secondary | ICD-10-CM | POA: Diagnosis not present

## 2020-02-20 DIAGNOSIS — Z23 Encounter for immunization: Secondary | ICD-10-CM | POA: Diagnosis not present

## 2020-02-20 DIAGNOSIS — I129 Hypertensive chronic kidney disease with stage 1 through stage 4 chronic kidney disease, or unspecified chronic kidney disease: Secondary | ICD-10-CM | POA: Diagnosis not present

## 2020-02-20 DIAGNOSIS — R112 Nausea with vomiting, unspecified: Secondary | ICD-10-CM | POA: Diagnosis not present

## 2020-02-20 DIAGNOSIS — N1831 Chronic kidney disease, stage 3a: Secondary | ICD-10-CM | POA: Diagnosis not present

## 2020-02-20 DIAGNOSIS — E782 Mixed hyperlipidemia: Secondary | ICD-10-CM | POA: Diagnosis not present

## 2020-02-20 DIAGNOSIS — G43019 Migraine without aura, intractable, without status migrainosus: Secondary | ICD-10-CM | POA: Diagnosis not present

## 2020-02-20 DIAGNOSIS — E663 Overweight: Secondary | ICD-10-CM | POA: Diagnosis not present

## 2020-02-20 DIAGNOSIS — Z6829 Body mass index (BMI) 29.0-29.9, adult: Secondary | ICD-10-CM | POA: Diagnosis not present

## 2020-03-08 DIAGNOSIS — C44529 Squamous cell carcinoma of skin of other part of trunk: Secondary | ICD-10-CM | POA: Diagnosis not present

## 2020-03-08 DIAGNOSIS — D225 Melanocytic nevi of trunk: Secondary | ICD-10-CM | POA: Diagnosis not present

## 2020-04-09 DIAGNOSIS — Z85828 Personal history of other malignant neoplasm of skin: Secondary | ICD-10-CM | POA: Diagnosis not present

## 2020-04-09 DIAGNOSIS — L7211 Pilar cyst: Secondary | ICD-10-CM | POA: Diagnosis not present

## 2020-04-09 DIAGNOSIS — Z08 Encounter for follow-up examination after completed treatment for malignant neoplasm: Secondary | ICD-10-CM | POA: Diagnosis not present

## 2020-04-28 DIAGNOSIS — M25561 Pain in right knee: Secondary | ICD-10-CM | POA: Diagnosis not present

## 2020-04-30 ENCOUNTER — Other Ambulatory Visit: Payer: Self-pay

## 2020-04-30 ENCOUNTER — Ambulatory Visit (HOSPITAL_COMMUNITY)
Admission: RE | Admit: 2020-04-30 | Discharge: 2020-04-30 | Disposition: A | Discharge status: Home / Self Care | Payer: PPO | Source: Ambulatory Visit | Attending: Internal Medicine | Admitting: Internal Medicine

## 2020-04-30 ENCOUNTER — Other Ambulatory Visit (HOSPITAL_COMMUNITY): Payer: Self-pay | Admitting: Internal Medicine

## 2020-04-30 DIAGNOSIS — M25561 Pain in right knee: Secondary | ICD-10-CM | POA: Diagnosis not present

## 2020-04-30 DIAGNOSIS — M1711 Unilateral primary osteoarthritis, right knee: Secondary | ICD-10-CM | POA: Diagnosis not present

## 2020-04-30 DIAGNOSIS — M11261 Other chondrocalcinosis, right knee: Secondary | ICD-10-CM | POA: Diagnosis not present

## 2020-04-30 DIAGNOSIS — S8391XA Sprain of unspecified site of right knee, initial encounter: Secondary | ICD-10-CM | POA: Diagnosis not present

## 2020-05-10 DIAGNOSIS — M25561 Pain in right knee: Secondary | ICD-10-CM | POA: Diagnosis not present

## 2020-06-13 DIAGNOSIS — L7211 Pilar cyst: Secondary | ICD-10-CM | POA: Diagnosis not present

## 2020-08-14 DIAGNOSIS — N1831 Chronic kidney disease, stage 3a: Secondary | ICD-10-CM | POA: Diagnosis not present

## 2020-08-14 DIAGNOSIS — Z0001 Encounter for general adult medical examination with abnormal findings: Secondary | ICD-10-CM | POA: Diagnosis not present

## 2020-08-14 DIAGNOSIS — Z6829 Body mass index (BMI) 29.0-29.9, adult: Secondary | ICD-10-CM | POA: Diagnosis not present

## 2020-08-14 DIAGNOSIS — E663 Overweight: Secondary | ICD-10-CM | POA: Diagnosis not present

## 2020-08-14 DIAGNOSIS — R944 Abnormal results of kidney function studies: Secondary | ICD-10-CM | POA: Diagnosis not present

## 2020-08-14 DIAGNOSIS — Z6828 Body mass index (BMI) 28.0-28.9, adult: Secondary | ICD-10-CM | POA: Diagnosis not present

## 2020-08-14 DIAGNOSIS — I10 Essential (primary) hypertension: Secondary | ICD-10-CM | POA: Diagnosis not present

## 2020-08-14 DIAGNOSIS — E782 Mixed hyperlipidemia: Secondary | ICD-10-CM | POA: Diagnosis not present

## 2020-08-14 DIAGNOSIS — R7301 Impaired fasting glucose: Secondary | ICD-10-CM | POA: Diagnosis not present

## 2020-08-14 DIAGNOSIS — I129 Hypertensive chronic kidney disease with stage 1 through stage 4 chronic kidney disease, or unspecified chronic kidney disease: Secondary | ICD-10-CM | POA: Diagnosis not present

## 2020-08-14 DIAGNOSIS — Z125 Encounter for screening for malignant neoplasm of prostate: Secondary | ICD-10-CM | POA: Diagnosis not present

## 2020-08-15 ENCOUNTER — Encounter (HOSPITAL_COMMUNITY): Payer: Self-pay | Admitting: Emergency Medicine

## 2020-08-15 ENCOUNTER — Emergency Department (HOSPITAL_COMMUNITY)
Admission: EM | Admit: 2020-08-15 | Discharge: 2020-08-15 | Disposition: A | Discharge status: Home / Self Care | Payer: PPO | Attending: Emergency Medicine | Admitting: Emergency Medicine

## 2020-08-15 ENCOUNTER — Emergency Department (HOSPITAL_COMMUNITY): Payer: PPO

## 2020-08-15 ENCOUNTER — Other Ambulatory Visit: Payer: Self-pay

## 2020-08-15 DIAGNOSIS — Z79899 Other long term (current) drug therapy: Secondary | ICD-10-CM | POA: Insufficient documentation

## 2020-08-15 DIAGNOSIS — I10 Essential (primary) hypertension: Secondary | ICD-10-CM | POA: Insufficient documentation

## 2020-08-15 DIAGNOSIS — Z87891 Personal history of nicotine dependence: Secondary | ICD-10-CM | POA: Insufficient documentation

## 2020-08-15 DIAGNOSIS — Z23 Encounter for immunization: Secondary | ICD-10-CM | POA: Diagnosis not present

## 2020-08-15 DIAGNOSIS — W298XXA Contact with other powered powered hand tools and household machinery, initial encounter: Secondary | ICD-10-CM | POA: Insufficient documentation

## 2020-08-15 DIAGNOSIS — S61011A Laceration without foreign body of right thumb without damage to nail, initial encounter: Secondary | ICD-10-CM | POA: Insufficient documentation

## 2020-08-15 DIAGNOSIS — S6991XA Unspecified injury of right wrist, hand and finger(s), initial encounter: Secondary | ICD-10-CM | POA: Diagnosis present

## 2020-08-15 MED ORDER — CEPHALEXIN 500 MG PO CAPS: 500.0000 mg | ORAL_CAPSULE | Freq: Two times a day (BID) | ORAL | 0 refills | Status: AC

## 2020-08-15 MED ORDER — POVIDONE-IODINE 10 % EX SOLN
CUTANEOUS | Status: AC
  Filled 2020-08-15: qty 15

## 2020-08-15 MED ORDER — LIDOCAINE HCL (PF) 1 % IJ SOLN
5.0000 mL | Freq: Once | INTRAMUSCULAR | Status: AC
  Administered 2020-08-15: 5 mL
  Filled 2020-08-15: qty 30

## 2020-08-15 MED ORDER — TETANUS-DIPHTH-ACELL PERTUSSIS 5-2.5-18.5 LF-MCG/0.5 IM SUSY
0.5000 mL | PREFILLED_SYRINGE | Freq: Once | INTRAMUSCULAR | Status: AC
  Administered 2020-08-15: 0.5 mL via INTRAMUSCULAR
  Filled 2020-08-15: qty 0.5

## 2020-08-15 NOTE — ED Triage Notes (Signed)
Pt cut his right thumb on a drill press 30 mins. Bleeding controlled

## 2020-08-15 NOTE — ED Provider Notes (Signed)
[facility] EMERGENCY DEPARTMENT Provider Note   CSN: [id number] Arrival date & time: 08/15/20  0093     History Chief Complaint  Patient presents with  . Laceration    Shaun Potter is a 65 y.o. male.  HPI   Patient with no significant medical history presents to the emergency department with chief complaint of laceration on his right thumb.  He endorses that he was drilling a piece of metal and the metal spun out from the drill and cut his right thumb.  He denies hitting his head, losing conscious, is not on anticoagulant.  He endorses that he was able control the bleeding with direct pressure, he has occasional numbness in his thumb but is able to move his thumb without difficulty.  Patient is not immunocompromise, is not up-to-date on his tetanus shot, denies alleviating factors.  Patient denies headaches, fevers, chills, shortness breath, chest pain, abdominal pain, nausea, vomiting, diarrhea, worsening pedal edema.  Past Medical History:  Diagnosis Date  . Anxiety   . Borderline hypertension 2012  . Chest pain 2012   [facility] admission in 10/2010; associated with palpitations; negative Holter in 2007  . History of kidney stones   . Hypertension   . Migraine headache   . Nephrolithiasis 1992   s/p cystoscopic stone extraction  . Sinus bradycardia 2012    Patient Active Problem List   Diagnosis Date Noted  . Encounter for screening colonoscopy   . Chest pain   . Nephrolithiasis   . Migraine headache     Past Surgical History:  Procedure Laterality Date  . COLONOSCOPY  2006  . COLONOSCOPY N/A 05/04/2015   Procedure: COLONOSCOPY;  Surgeon: Daneil Dolin, MD;  Location: [facility];  Service: Endoscopy;  Laterality: N/A;  0730  . EAR CYST EXCISION Right 11/10/2012   Procedure: EXCISION RIGHT THIGH CYST;  Surgeon: Donato Heinz, MD;  Location: [facility];  Service: General;  Laterality: Right;  . URETEROSCOPY  2004   + stone extraction       Family  History  Problem Relation Age of Onset  . Arrhythmia Father   . Coronary artery disease Neg Hx     Social History   Tobacco Use  . Smoking status: Former Smoker    Packs/day: 1.50    Years: 6.00    Pack years: 9.00    Types: Cigarettes    Quit date: 11/04/1992    Years since quitting: 27.7  . Smokeless tobacco: Former Network engineer Use Topics  . Alcohol use: No  . Drug use: No    Home Medications Prior to Admission medications   Medication Sig Start Date End Date Taking? Authorizing Provider  cephALEXin (KEFLEX) 500 MG capsule Take 1 capsule (500 mg total) by mouth 2 (two) times daily for 7 days. 08/15/20 08/22/20 Yes Marcello Fennel, PA-C  olmesartan (BENICAR) 20 MG tablet Take 20 mg by mouth daily.   Yes [provider]  rizatriptan (MAXALT) 10 MG tablet Take 10 mg by mouth daily as needed for migraine. 08/09/20  Yes [provider]  polyethylene glycol-electrolytes (NULYTELY/GOLYTELY) 420 G solution Take 4,000 mLs by mouth once. Patient not taking: Reported on 08/15/2020 04/27/15   Mahala Menghini, PA-C    Allergies    Patient has no known allergies.  Review of Systems   Review of Systems  Constitutional: Negative for chills and fever.  HENT: Negative for congestion.   Respiratory: Negative for shortness of breath.  Cardiovascular: Negative for chest pain.  Gastrointestinal: Negative for abdominal pain.  Genitourinary: Negative for enuresis.  Musculoskeletal: Negative for back pain.  Skin: Positive for wound. Negative for rash.       Cut on his right thumb  Neurological: Negative for dizziness.  Hematological: Does not bruise/bleed easily.    Physical Exam Updated Vital Signs BP (!) 154/101   Pulse 66   Temp 98.5 F (36.9 C) (Oral)   Resp 18   Ht 6\' 2"  (1.88 m)   Wt 99.8 kg   SpO2 98%   BMI 28.25 kg/m   Physical Exam Vitals and nursing note reviewed.  Constitutional:      General: He is not in acute distress.    Appearance: He is  not ill-appearing.  HENT:     Head: Normocephalic and atraumatic.     Nose: No congestion.  Eyes:     Conjunctiva/sclera: Conjunctivae normal.  Cardiovascular:     Rate and Rhythm: Normal rate and regular rhythm.  Pulmonary:     Effort: Pulmonary effort is normal.  Musculoskeletal:     Comments: Patient's right hand was visualized he has a noted laceration on the dorsal aspect of patient's right thumb, distally to the MCP joint, measuring approximately 5 cm in length, 2 mm in depth, there is no noted ligament or tendon damage, no foreign body present.  Patient has full range of motion in all joints, neurovascular fully intact  Skin:    General: Skin is warm and dry.  Neurological:     Mental Status: He is alert.  Psychiatric:        Mood and Affect: Mood normal.     ED Results / Procedures / Treatments   Labs (all labs ordered are listed, but only abnormal results are displayed) Labs Reviewed - No data to display  EKG None  Radiology DG Finger Thumb Right  Result Date: 08/15/2020 CLINICAL DATA:  Thumb laceration. EXAM: RIGHT THUMB 2+V COMPARISON:  None. FINDINGS: Negative for fracture or dislocation. No evidence for a radiopaque foreign body. No focal soft tissue abnormality. IMPRESSION: No acute bone abnormality. Electronically Signed   By: Markus Daft M.D.   On: 08/15/2020 10:45    Procedures .Marland KitchenLaceration Repair  Date/Time: 08/15/2020 11:12 AM Performed by: Marcello Fennel, PA-C Authorized by: Marcello Fennel, PA-C   Consent:    Consent obtained:  Verbal   Consent given by:  Patient   Risks discussed:  Infection, pain, retained foreign body, need for additional repair, poor cosmetic result, tendon damage, vascular damage, poor wound healing and nerve damage   Alternatives discussed:  No treatment Universal protocol:    Patient identity confirmed:  Verbally with patient Anesthesia:    Anesthesia method:  Local infiltration   Local anesthetic:  Lidocaine 1% w/o  epi Laceration details:    Location:  Finger   Finger location:  R thumb   Length (cm):  5   Depth (mm):  2 Pre-procedure details:    Preparation:  Patient was prepped and draped in usual sterile fashion and imaging obtained to evaluate for foreign bodies Exploration:    Hemostasis achieved with:  Direct pressure   Imaging obtained: x-ray     Imaging outcome: foreign body not noted     Wound exploration: wound explored through full range of motion and entire depth of wound visualized     Contaminated: no   Treatment:    Area cleansed with:  Betadine and saline   Amount of  cleaning:  Standard   Irrigation solution:  Sterile water   Irrigation method:  Syringe   Visualized foreign bodies/material removed: no     Debridement:  None Skin repair:    Repair method:  Sutures   Suture size:  4-0   Suture material:  Prolene   Suture technique:  Simple interrupted   Number of sutures:  5 Approximation:    Approximation:  Close Post-procedure details:    Dressing:  Non-adherent dressing   Procedure completion:  Tolerated well, no immediate complications Comments:     After the procedure patient motor, sensation, strength were all intact.     Medications Ordered in ED Medications  lidocaine (PF) (XYLOCAINE) 1 % injection 5 mL (5 mLs Infiltration Given 08/15/20 1016)  Tdap (BOOSTRIX) injection 0.5 mL (0.5 mLs Intramuscular Given 08/15/20 1016)  povidone-iodine (BETADINE) 10 % external solution (  Given 08/15/20 1018)    ED Course  I have reviewed the triage vital signs and the nursing notes.  Pertinent labs & imaging results that were available during my care of the patient were reviewed by me and considered in my medical decision making (see chart for details).    MDM Rules/Calculators/A&P                         Initial impression-patient presents with laceration to his right thumb.  He is alert, does not appear in acute distress, vital signs reassuring.  Will update patient's  tetanus shot, order x-ray of thumb for foreign body rule out, recommend suturing of wound for improved healing.  Work-up-x-ray of thumb is negative for acute findings.  Reassessment Will recommend suturing to decrease infection risk and to assist with the healing process.  Patient was agreeable with this and tolerated the procedure well.  He received 5 sutures.  Neurovascular was fully intact after the procedure.  Rule out-Low suspicion for fracture or dislocation as x-ray does not reveal any acute findings. low suspicion for ligament or tendon damage as area was palpated no gross defects noted, he had full range of motion in his right thumb.  Low suspicion for compartment syndrome as area was palpated it was soft to the touch, neurovascular fully intact before and after the procedure  Plan-patient has a laceration and received 5 sutures.  Will place him on antibiotics, recommend over-the-counter pain medications, follow-up with PCP in 10 to 14 days for suture removal.  Vital signs have remained stable, no indication for hospital admission.  Patient given at home care as well strict return precautions.  Patient verbalized that they understood agreed to said plan.   Final Clinical Impression(s) / ED Diagnoses Final diagnoses:  Laceration of right thumb without foreign body without damage to nail, initial encounter    Rx / DC Orders ED Discharge Orders         Ordered    cephALEXin (KEFLEX) 500 MG capsule  2 times daily        08/15/20 1115           Aron Baba 08/15/20 1116    Milton Ferguson, MD 08/17/20 1335

## 2020-08-15 NOTE — ED Notes (Signed)
Portable x-ray done

## 2020-08-15 NOTE — Discharge Instructions (Signed)
You have a laceration of your right thumb, you have received 5 sutures, please abstain from getting  the wound wet for the first 24 hours.  After that I would like you to rinse out the wound and change the dressings twice a day.  I have started you on antibiotics please take as prescribed.  For pain you may take over-the-counter pain medication like ibuprofen and/or Tylenol every 6 hours as needed please follow dosing back of bottle.  I would like you to follow-up with your PCP, urgent care, this department in the next 10 to 14 days to have your sutures removed,  Come back to the emergency department if you develop chest pain, shortness of breath, severe abdominal pain, uncontrolled nausea, vomiting, diarrhea.

## 2020-08-21 DIAGNOSIS — N1831 Chronic kidney disease, stage 3a: Secondary | ICD-10-CM | POA: Diagnosis not present

## 2020-08-21 DIAGNOSIS — Z6829 Body mass index (BMI) 29.0-29.9, adult: Secondary | ICD-10-CM | POA: Diagnosis not present

## 2020-08-21 DIAGNOSIS — I129 Hypertensive chronic kidney disease with stage 1 through stage 4 chronic kidney disease, or unspecified chronic kidney disease: Secondary | ICD-10-CM | POA: Diagnosis not present

## 2020-08-21 DIAGNOSIS — E663 Overweight: Secondary | ICD-10-CM | POA: Diagnosis not present

## 2020-08-21 DIAGNOSIS — E782 Mixed hyperlipidemia: Secondary | ICD-10-CM | POA: Diagnosis not present

## 2020-08-21 DIAGNOSIS — G43019 Migraine without aura, intractable, without status migrainosus: Secondary | ICD-10-CM | POA: Diagnosis not present

## 2020-11-26 DIAGNOSIS — Z85828 Personal history of other malignant neoplasm of skin: Secondary | ICD-10-CM | POA: Diagnosis not present

## 2020-11-26 DIAGNOSIS — Z08 Encounter for follow-up examination after completed treatment for malignant neoplasm: Secondary | ICD-10-CM | POA: Diagnosis not present

## 2020-11-26 DIAGNOSIS — L7211 Pilar cyst: Secondary | ICD-10-CM | POA: Diagnosis not present

## 2021-01-18 DIAGNOSIS — J069 Acute upper respiratory infection, unspecified: Secondary | ICD-10-CM | POA: Diagnosis not present

## 2021-01-18 DIAGNOSIS — J209 Acute bronchitis, unspecified: Secondary | ICD-10-CM | POA: Diagnosis not present

## 2021-01-28 DIAGNOSIS — U071 COVID-19: Secondary | ICD-10-CM | POA: Diagnosis not present

## 2021-01-28 DIAGNOSIS — J40 Bronchitis, not specified as acute or chronic: Secondary | ICD-10-CM | POA: Diagnosis not present

## 2021-02-08 DIAGNOSIS — I10 Essential (primary) hypertension: Secondary | ICD-10-CM | POA: Diagnosis not present

## 2021-02-08 DIAGNOSIS — E1165 Type 2 diabetes mellitus with hyperglycemia: Secondary | ICD-10-CM | POA: Diagnosis not present

## 2021-02-13 DIAGNOSIS — R7301 Impaired fasting glucose: Secondary | ICD-10-CM | POA: Diagnosis not present

## 2021-02-13 DIAGNOSIS — I10 Essential (primary) hypertension: Secondary | ICD-10-CM | POA: Diagnosis not present

## 2021-02-19 DIAGNOSIS — U099 Post covid-19 condition, unspecified: Secondary | ICD-10-CM | POA: Diagnosis not present

## 2021-02-19 DIAGNOSIS — Z23 Encounter for immunization: Secondary | ICD-10-CM | POA: Diagnosis not present

## 2021-02-19 DIAGNOSIS — E663 Overweight: Secondary | ICD-10-CM | POA: Diagnosis not present

## 2021-02-19 DIAGNOSIS — N1831 Chronic kidney disease, stage 3a: Secondary | ICD-10-CM | POA: Diagnosis not present

## 2021-02-19 DIAGNOSIS — Z0001 Encounter for general adult medical examination with abnormal findings: Secondary | ICD-10-CM | POA: Diagnosis not present

## 2021-02-19 DIAGNOSIS — I129 Hypertensive chronic kidney disease with stage 1 through stage 4 chronic kidney disease, or unspecified chronic kidney disease: Secondary | ICD-10-CM | POA: Diagnosis not present

## 2021-02-19 DIAGNOSIS — G43019 Migraine without aura, intractable, without status migrainosus: Secondary | ICD-10-CM | POA: Diagnosis not present

## 2021-02-19 DIAGNOSIS — E782 Mixed hyperlipidemia: Secondary | ICD-10-CM | POA: Diagnosis not present

## 2021-02-19 DIAGNOSIS — R7301 Impaired fasting glucose: Secondary | ICD-10-CM | POA: Diagnosis not present

## 2021-08-15 DIAGNOSIS — E782 Mixed hyperlipidemia: Secondary | ICD-10-CM | POA: Diagnosis not present

## 2021-08-15 DIAGNOSIS — R7301 Impaired fasting glucose: Secondary | ICD-10-CM | POA: Diagnosis not present

## 2021-08-20 DIAGNOSIS — E782 Mixed hyperlipidemia: Secondary | ICD-10-CM | POA: Diagnosis not present

## 2021-08-20 DIAGNOSIS — E663 Overweight: Secondary | ICD-10-CM | POA: Diagnosis not present

## 2021-08-20 DIAGNOSIS — U099 Post covid-19 condition, unspecified: Secondary | ICD-10-CM | POA: Diagnosis not present

## 2021-08-20 DIAGNOSIS — Z125 Encounter for screening for malignant neoplasm of prostate: Secondary | ICD-10-CM | POA: Diagnosis not present

## 2021-08-20 DIAGNOSIS — N1831 Chronic kidney disease, stage 3a: Secondary | ICD-10-CM | POA: Diagnosis not present

## 2021-08-20 DIAGNOSIS — Z6828 Body mass index (BMI) 28.0-28.9, adult: Secondary | ICD-10-CM | POA: Diagnosis not present

## 2021-08-20 DIAGNOSIS — R7301 Impaired fasting glucose: Secondary | ICD-10-CM | POA: Diagnosis not present

## 2021-08-20 DIAGNOSIS — I10 Essential (primary) hypertension: Secondary | ICD-10-CM | POA: Diagnosis not present

## 2021-08-20 DIAGNOSIS — G43019 Migraine without aura, intractable, without status migrainosus: Secondary | ICD-10-CM | POA: Diagnosis not present

## 2021-08-20 DIAGNOSIS — I129 Hypertensive chronic kidney disease with stage 1 through stage 4 chronic kidney disease, or unspecified chronic kidney disease: Secondary | ICD-10-CM | POA: Diagnosis not present

## 2021-09-03 DIAGNOSIS — I129 Hypertensive chronic kidney disease with stage 1 through stage 4 chronic kidney disease, or unspecified chronic kidney disease: Secondary | ICD-10-CM | POA: Diagnosis not present

## 2021-09-17 DIAGNOSIS — I129 Hypertensive chronic kidney disease with stage 1 through stage 4 chronic kidney disease, or unspecified chronic kidney disease: Secondary | ICD-10-CM | POA: Diagnosis not present

## 2021-10-01 DIAGNOSIS — I129 Hypertensive chronic kidney disease with stage 1 through stage 4 chronic kidney disease, or unspecified chronic kidney disease: Secondary | ICD-10-CM | POA: Diagnosis not present

## 2021-10-15 DIAGNOSIS — I129 Hypertensive chronic kidney disease with stage 1 through stage 4 chronic kidney disease, or unspecified chronic kidney disease: Secondary | ICD-10-CM | POA: Diagnosis not present

## 2021-11-04 DIAGNOSIS — I129 Hypertensive chronic kidney disease with stage 1 through stage 4 chronic kidney disease, or unspecified chronic kidney disease: Secondary | ICD-10-CM | POA: Diagnosis not present

## 2021-11-18 DIAGNOSIS — I10 Essential (primary) hypertension: Secondary | ICD-10-CM | POA: Diagnosis not present

## 2021-11-28 DIAGNOSIS — C44311 Basal cell carcinoma of skin of nose: Secondary | ICD-10-CM | POA: Diagnosis not present

## 2021-11-28 DIAGNOSIS — Z08 Encounter for follow-up examination after completed treatment for malignant neoplasm: Secondary | ICD-10-CM | POA: Diagnosis not present

## 2021-11-28 DIAGNOSIS — L7211 Pilar cyst: Secondary | ICD-10-CM | POA: Diagnosis not present

## 2021-11-28 DIAGNOSIS — Z85828 Personal history of other malignant neoplasm of skin: Secondary | ICD-10-CM | POA: Diagnosis not present

## 2021-11-28 IMAGING — DX DG KNEE COMPLETE 4+V*R*
4 series · 4 of 4 positions shown · non-contrast
Comparison: No prior.

CLINICAL DATA: Twisted knee.

EXAM:
RIGHT KNEE - COMPLETE 4+ VIEW

[knee ap]
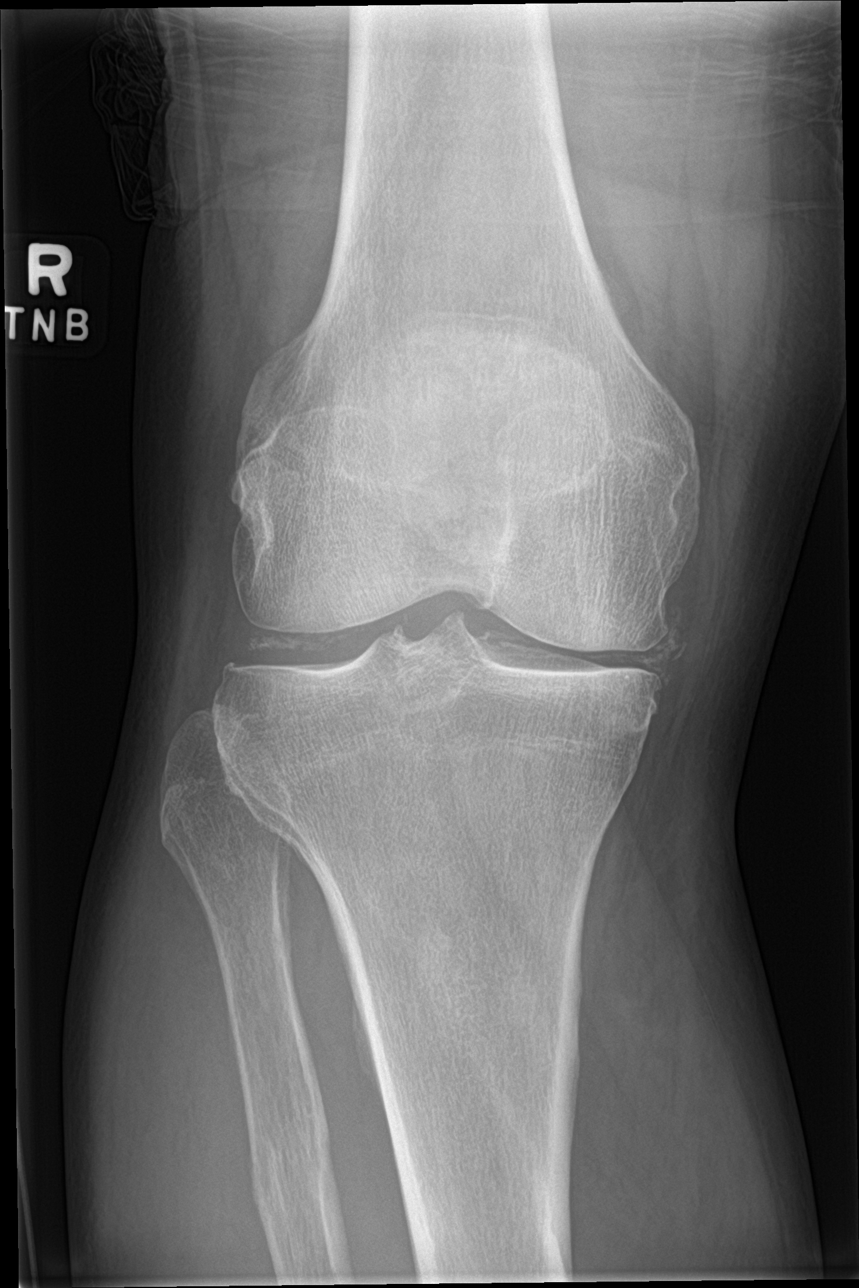

[knee obl (1 of 2)]
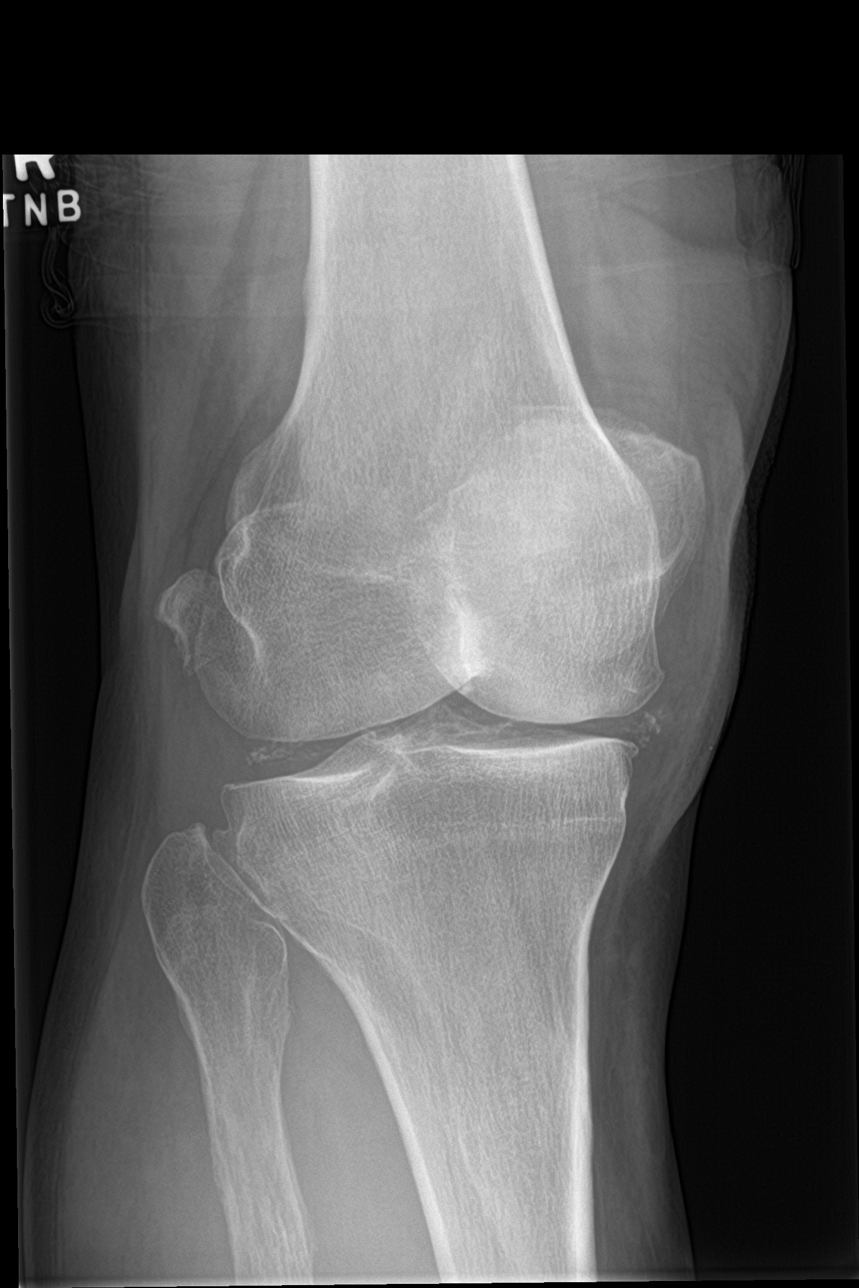

[knee obl (2 of 2)]
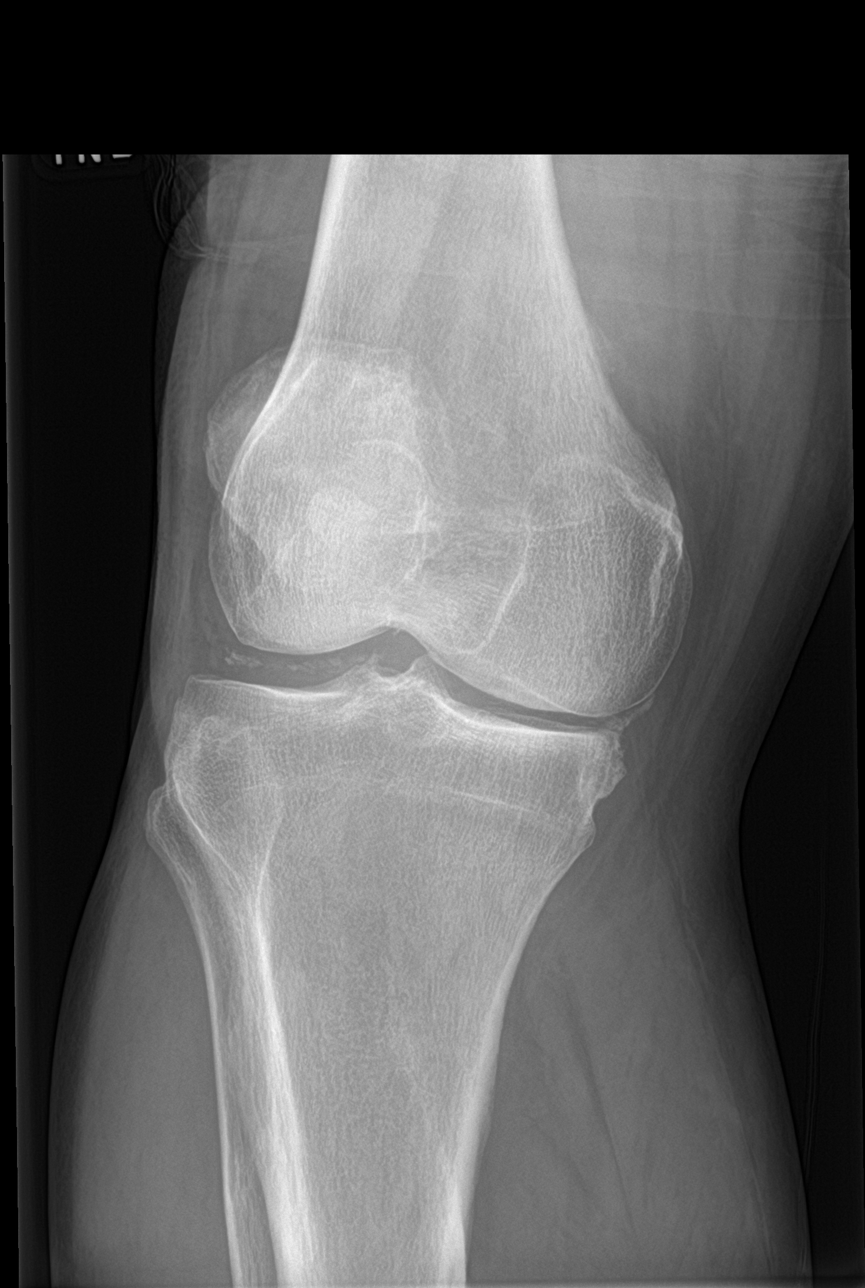

[knee lat]
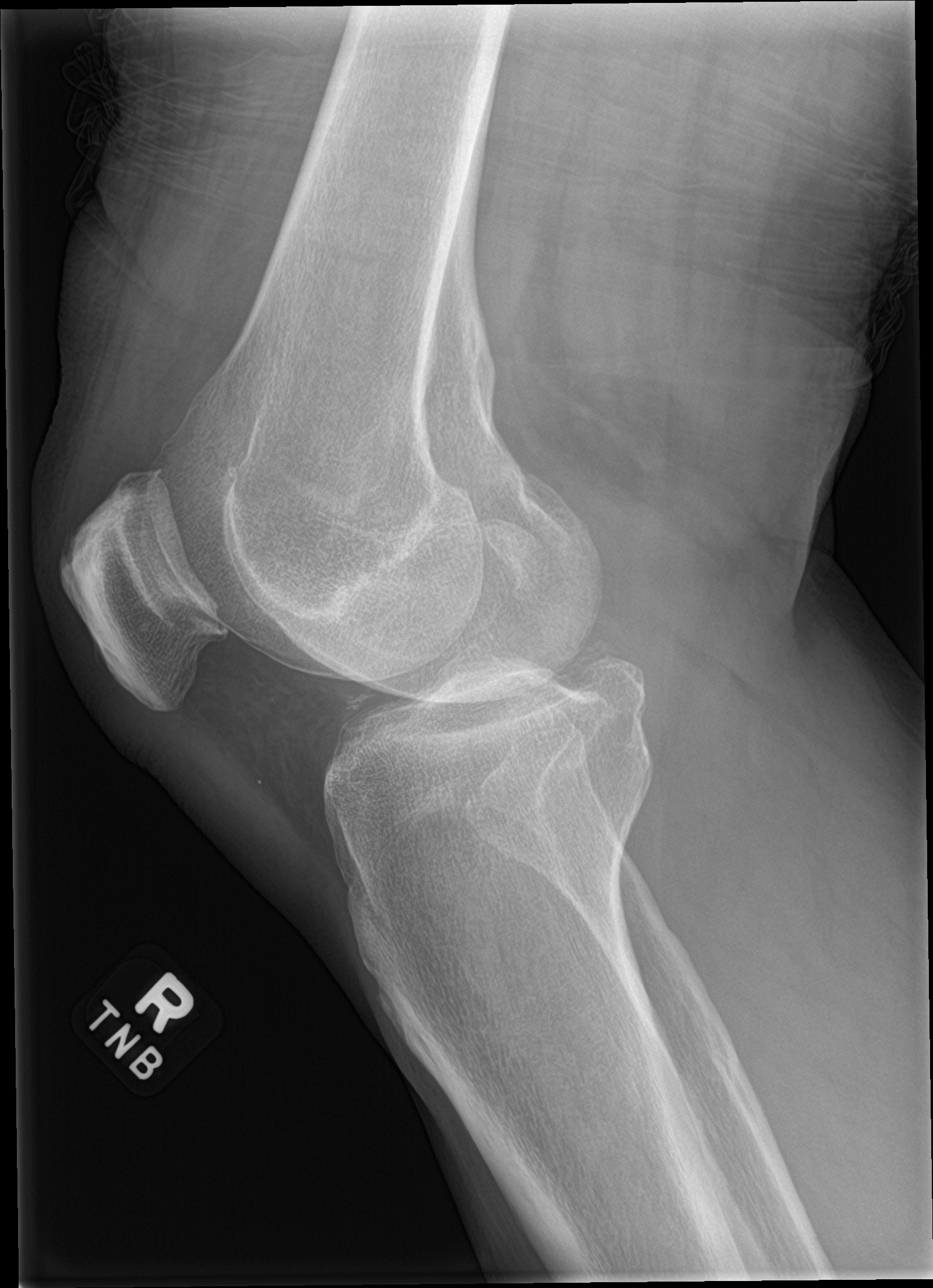

[4 of 4 positions shown; findings below may reference images not displayed]

FINDINGS: Diffuse tricompartment degenerative change with chondrocalcinosis
noted. Prominent corticated bony density noted along the lateral
aspect of the right knee, most likely a prominent fabella. No
evidence of acute fracture or dislocation. No acute bony abnormality
identified. No prominent effusion.
IMPRESSION: Diffuse tricompartment degenerative change with chondrocalcinosis.
No acute abnormality identified.

## 2021-12-02 DIAGNOSIS — I10 Essential (primary) hypertension: Secondary | ICD-10-CM | POA: Diagnosis not present

## 2022-02-12 DIAGNOSIS — Z125 Encounter for screening for malignant neoplasm of prostate: Secondary | ICD-10-CM | POA: Diagnosis not present

## 2022-02-12 DIAGNOSIS — I129 Hypertensive chronic kidney disease with stage 1 through stage 4 chronic kidney disease, or unspecified chronic kidney disease: Secondary | ICD-10-CM | POA: Diagnosis not present

## 2022-02-19 DIAGNOSIS — Z0000 Encounter for general adult medical examination without abnormal findings: Secondary | ICD-10-CM | POA: Diagnosis not present

## 2022-02-19 DIAGNOSIS — I129 Hypertensive chronic kidney disease with stage 1 through stage 4 chronic kidney disease, or unspecified chronic kidney disease: Secondary | ICD-10-CM | POA: Diagnosis not present

## 2022-02-19 DIAGNOSIS — G43019 Migraine without aura, intractable, without status migrainosus: Secondary | ICD-10-CM | POA: Diagnosis not present

## 2022-02-19 DIAGNOSIS — Z125 Encounter for screening for malignant neoplasm of prostate: Secondary | ICD-10-CM | POA: Diagnosis not present

## 2022-02-19 DIAGNOSIS — Z23 Encounter for immunization: Secondary | ICD-10-CM | POA: Diagnosis not present

## 2022-02-19 DIAGNOSIS — N1831 Chronic kidney disease, stage 3a: Secondary | ICD-10-CM | POA: Diagnosis not present

## 2022-02-19 DIAGNOSIS — Z6828 Body mass index (BMI) 28.0-28.9, adult: Secondary | ICD-10-CM | POA: Diagnosis not present

## 2022-02-19 DIAGNOSIS — E782 Mixed hyperlipidemia: Secondary | ICD-10-CM | POA: Diagnosis not present

## 2022-02-19 DIAGNOSIS — R7301 Impaired fasting glucose: Secondary | ICD-10-CM | POA: Diagnosis not present

## 2022-02-19 DIAGNOSIS — E663 Overweight: Secondary | ICD-10-CM | POA: Diagnosis not present

## 2022-02-28 DIAGNOSIS — I129 Hypertensive chronic kidney disease with stage 1 through stage 4 chronic kidney disease, or unspecified chronic kidney disease: Secondary | ICD-10-CM | POA: Diagnosis not present

## 2022-06-19 DIAGNOSIS — L239 Allergic contact dermatitis, unspecified cause: Secondary | ICD-10-CM | POA: Diagnosis not present

## 2022-06-19 DIAGNOSIS — Z6831 Body mass index (BMI) 31.0-31.9, adult: Secondary | ICD-10-CM | POA: Diagnosis not present

## 2022-06-19 DIAGNOSIS — E669 Obesity, unspecified: Secondary | ICD-10-CM | POA: Diagnosis not present

## 2022-06-19 DIAGNOSIS — I10 Essential (primary) hypertension: Secondary | ICD-10-CM | POA: Diagnosis not present

## 2022-09-02 DIAGNOSIS — R112 Nausea with vomiting, unspecified: Secondary | ICD-10-CM | POA: Diagnosis not present

## 2022-09-02 DIAGNOSIS — I10 Essential (primary) hypertension: Secondary | ICD-10-CM | POA: Diagnosis not present

## 2022-09-09 DIAGNOSIS — R1013 Epigastric pain: Secondary | ICD-10-CM | POA: Diagnosis not present

## 2022-09-09 DIAGNOSIS — Z6829 Body mass index (BMI) 29.0-29.9, adult: Secondary | ICD-10-CM | POA: Diagnosis not present

## 2022-09-09 DIAGNOSIS — R110 Nausea: Secondary | ICD-10-CM | POA: Diagnosis not present

## 2022-09-09 DIAGNOSIS — R1011 Right upper quadrant pain: Secondary | ICD-10-CM | POA: Diagnosis not present

## 2022-09-09 DIAGNOSIS — Z713 Dietary counseling and surveillance: Secondary | ICD-10-CM | POA: Diagnosis not present

## 2022-10-15 DIAGNOSIS — R7303 Prediabetes: Secondary | ICD-10-CM | POA: Diagnosis not present

## 2022-10-22 ENCOUNTER — Other Ambulatory Visit (HOSPITAL_COMMUNITY): Payer: Self-pay | Admitting: Family Medicine

## 2022-10-22 DIAGNOSIS — R1013 Epigastric pain: Secondary | ICD-10-CM | POA: Diagnosis not present

## 2022-10-22 DIAGNOSIS — G43019 Migraine without aura, intractable, without status migrainosus: Secondary | ICD-10-CM | POA: Diagnosis not present

## 2022-10-22 DIAGNOSIS — E663 Overweight: Secondary | ICD-10-CM | POA: Diagnosis not present

## 2022-10-22 DIAGNOSIS — M791 Myalgia, unspecified site: Secondary | ICD-10-CM | POA: Diagnosis not present

## 2022-10-22 DIAGNOSIS — Z713 Dietary counseling and surveillance: Secondary | ICD-10-CM | POA: Diagnosis not present

## 2022-10-22 DIAGNOSIS — R7301 Impaired fasting glucose: Secondary | ICD-10-CM | POA: Diagnosis not present

## 2022-10-22 DIAGNOSIS — E782 Mixed hyperlipidemia: Secondary | ICD-10-CM | POA: Diagnosis not present

## 2022-10-22 DIAGNOSIS — N1831 Chronic kidney disease, stage 3a: Secondary | ICD-10-CM | POA: Diagnosis not present

## 2022-10-22 DIAGNOSIS — Z79899 Other long term (current) drug therapy: Secondary | ICD-10-CM | POA: Diagnosis not present

## 2022-10-22 DIAGNOSIS — I129 Hypertensive chronic kidney disease with stage 1 through stage 4 chronic kidney disease, or unspecified chronic kidney disease: Secondary | ICD-10-CM | POA: Diagnosis not present

## 2022-10-22 DIAGNOSIS — Z7182 Exercise counseling: Secondary | ICD-10-CM | POA: Diagnosis not present

## 2022-10-22 DIAGNOSIS — Z6828 Body mass index (BMI) 28.0-28.9, adult: Secondary | ICD-10-CM | POA: Diagnosis not present

## 2022-10-31 ENCOUNTER — Ambulatory Visit (HOSPITAL_COMMUNITY)
Admission: RE | Admit: 2022-10-31 | Discharge: 2022-10-31 | Disposition: A | Discharge status: Home / Self Care | Payer: PPO | Source: Ambulatory Visit | Attending: Family Medicine | Admitting: Family Medicine

## 2022-10-31 DIAGNOSIS — E782 Mixed hyperlipidemia: Secondary | ICD-10-CM

## 2022-12-04 NOTE — Progress Notes (Signed)
Shaun Potter, male    DOB: 09-19-1955    MRN: [id number]   Brief patient profile:  64  yowm  quit smoking 1994 s resp cc but longstanding difficult to control HBP  referred to pulmonary clinic in [place]  12/05/2022 by Lupita Raider for abnormal CT chest showing increased TA to 4.2 cm 10/31/22      History of Present Illness  12/05/2022  Pulmonary/ 1st office eval/ Shaun Potter / [facility]  Chief Complaint  Patient presents with   Pulmonary Consult    Referred by Lupita Raider, NP. Pt c/o feeling light headed with exertion such as yardwork for the past 3 months. He does not describe the feeling as SOB. Denies any cough.   Dyspnea:  limited by hips/knees since 2021 - denies limiting doe Cough: none  Sleep: one pillow / no snoring per wife or hypersomnolence per pt  SABA use: none  02: none     No obvious day to day or daytime pattern/variability or assoc excess/ purulent sputum or mucus plugs or hemoptysis or cp or chest tightness, subjective wheeze or overt sinus or hb symptoms.    Also denies any obvious fluctuation of symptoms with weather or environmental changes or other aggravating or alleviating factors except as outlined above   No unusual exposure hx or h/o childhood pna/ asthma or knowledge of premature birth.  Current Allergies, Complete Past Medical History, Past Surgical History, Family History, and Social History were reviewed in [employer] record.  ROS  The following are not active complaints unless bolded Hoarseness, sore throat, dysphagia, dental problems, itching, sneezing,  nasal congestion or discharge of excess mucus or purulent secretions, ear ache,   fever, chills, sweats, unintended wt loss or wt gain, classically pleuritic or exertional cp,  orthopnea pnd or arm/hand swelling  or leg swelling, presyncope, palpitations, abdominal pain, anorexia, nausea, vomiting, diarrhea  or change in bowel habits or change in bladder habits,  change in stools or change in urine, dysuria, hematuria,  rash, arthralgias, visual complaints, headache, numbness, weakness or ataxia or problems with walking or coordination,  change in mood or  memory.               Outpatient Medications Prior to Visit  Medication Sig Dispense Refill   olmesartan (BENICAR) 20 MG tablet One twice daily      rizatriptan (MAXALT) 10 MG tablet Take 10 mg by mouth daily as needed for migraine.     polyethylene glycol-electrolytes (NULYTELY/GOLYTELY) 420 G solution Take 4,000 mLs by mouth once. (Patient not taking: Reported on 08/15/2020) 4000 mL 0   No facility-administered medications prior to visit.    Past Medical History:  Diagnosis Date   Anxiety    Borderline hypertension 2012   Chest pain 2012   [facility] admission in 10/2010; associated with palpitations; negative Holter in 2007   History of kidney stones    Hypertension    Migraine headache    Nephrolithiasis 1992   s/p cystoscopic stone extraction   Sinus bradycardia 2012      Objective:     BP (!) 152/98 (BP Location: Left Arm, Cuff Size: Normal)   Pulse 66   Ht 6\' 2"  (1.88 m)   Wt 222 lb 9.6 oz (101 kg)   SpO2 97% Comment: on RA  BMI 28.58 kg/m   SpO2: 97 % (on RA) robust pleasant amb wm nad    HEENT : Oropharynx  clear  NECK :  without  apparent JVD/ palpable Nodes/TM    LUNGS: no acc muscle use,  Nl contour chest which is clear to A and P bilaterally without cough on insp or exp maneuvers   CV:  RRR  no s3 or murmur or increase in P2, and no edema   ABD:  soft and nontender with nl inspiratory excursion in the supine position. No bruits or organomegaly appreciated   MS:  Nl gait/ ext warm without deformities Or obvious joint restrictions  calf tenderness, cyanosis or clubbing    SKIN: warm and dry without lesions    NEURO:  alert, approp, nl sensorium with  no motor or cerebellar deficits apparent.     I personally reviewed images and agree  with radiology impression as follows:  Chest CT6/21/24  from CT cardiac: Vascular: No aortic atherosclerosis. Fusiform dilatation of the ascending aorta, 4.2 cm maximal dimension Mediastinum/nodes: No adenopathy or mass. Unremarkable esophagus.  Lungs: No focal airspace disease. No pulmonary nodule. No pleural fluid. The included airways are patent.         Assessment   Essential hypertension with Possible TA Longstanding / intol of amlodipine and relatively bradycardic at baseline 12/05/2022  -  CT chest 10/31/22 with 4.2 fusiform Aorta   Recs  >>> Control bp with addition of hydrodiuril then maybe add low dose minoxidil but avoid BB for now given low baseline pulse rate and h/o amlodipine side effects.  >>>Serial Creat already being followed by PCP  >>> Echocardiogram when bp down to less than 130/85   Cards probably will end up following this instead o T surgery if bp control remains elusive as they would need to start following him closely anyway with pulmonary f/u  prn            Each maintenance medication was reviewed in detail including emphasizing most importantly the difference between maintenance and prns and under what circumstances the prns are to be triggered using an action plan format where appropriate.  Total time for H and P, chart review, counseling,  and generating customized AVS unique to this office visit / same day charting = 45 min new pt eval             Sandrea Hughs, MD 12/05/2022

## 2022-12-05 ENCOUNTER — Encounter: Payer: Self-pay | Admitting: Internal Medicine

## 2022-12-05 ENCOUNTER — Ambulatory Visit: Payer: PPO | Admitting: Internal Medicine

## 2022-12-05 VITALS — BP 152/98 | HR 66 | Ht 74.0 in | Wt 222.6 lb

## 2022-12-05 DIAGNOSIS — T461X5A Adverse effect of calcium-channel blockers, initial encounter: Secondary | ICD-10-CM

## 2022-12-05 DIAGNOSIS — I10 Essential (primary) hypertension: Secondary | ICD-10-CM | POA: Diagnosis not present

## 2022-12-05 MED ORDER — HYDROCHLOROTHIAZIDE 25 MG PO TABS: ORAL_TABLET | ORAL | 11 refills | Status: DC

## 2022-12-05 NOTE — Assessment & Plan Note (Signed)
Longstanding / intol of amlodipine and relatively bradycardic at baseline 12/05/2022  -  CT chest 10/31/22 with 4.2 fusiform Aorta   Recs  >>> Control bp with addition of hydrodiuril then maybe add low dose minoxidil but avoid BB for now given low baseline pulse rate and h/o amlodipine side effects.  >>>Serial Creat already being followed by PCP  >>> Echocardiogram when bp down to less than 130/85   Cards probably will end up following this instead o T surgery if bp control remains elusive as they would need to start following him closely anyway with pulmonary f/u  prn            Each maintenance medication was reviewed in detail including emphasizing most importantly the difference between maintenance and prns and under what circumstances the prns are to be triggered using an action plan format where appropriate.  Total time for H and P, chart review, counseling,  and generating customized AVS unique to this office visit / same day charting = 45 min new pt eval

## 2022-12-05 NOTE — Patient Instructions (Addendum)
Hydrodiuril 25 mg one daily   Continue benicar 20 mg one  twice a daily   Call me with the kidney profile  K  and  Creatinine from your last visit with Dr Margo Aye     My office will be contacting you by phone for referral to echocardiogram but no need to do it until your blood pressure is down below  130/85   - if you don't hear back from my office within one week please call us back or notify us thru MyChart and we'll address it right away.    We will decide on follow up after the echo is back and see how your blood pressure is doing

## 2023-01-27 ENCOUNTER — Ambulatory Visit (HOSPITAL_COMMUNITY)
Admission: RE | Admit: 2023-01-27 | Discharge: 2023-01-27 | Disposition: A | Discharge status: Home / Self Care | Payer: PPO | Source: Ambulatory Visit | Attending: Internal Medicine | Admitting: Internal Medicine

## 2023-01-27 DIAGNOSIS — I71019 Dissection of thoracic aorta, unspecified: Secondary | ICD-10-CM | POA: Diagnosis not present

## 2023-01-27 DIAGNOSIS — I10 Essential (primary) hypertension: Secondary | ICD-10-CM | POA: Diagnosis not present

## 2023-01-27 NOTE — Progress Notes (Signed)
*  PRELIMINARY RESULTS* Echocardiogram 2D Echocardiogram has been performed.  Stacey Drain 01/27/2023, 11:05 AM

## 2023-01-29 ENCOUNTER — Telehealth: Payer: Self-pay

## 2023-01-29 DIAGNOSIS — I10 Essential (primary) hypertension: Secondary | ICD-10-CM

## 2023-01-29 NOTE — Telephone Encounter (Signed)
-----   Message from Sandrea Hughs sent at 01/28/2023  4:41 PM EDT ----- Call patient :  Shaun Potter is looks ok for now, no immediate concerns but there are some changes indicating needs tight bp control and f/u by cards would be advised if not already established.   Also I would rec ONO on RA to be sure low oxygen at night is not contributing to the findings and plan on f/u with me a week p ONO is obtained   Copy to PCP

## 2023-01-29 NOTE — Telephone Encounter (Signed)
I left a message for the patient to return my call.

## 2023-01-30 NOTE — Telephone Encounter (Signed)
Pt calling to return missed call [phone]

## 2023-02-04 NOTE — Telephone Encounter (Signed)
-----   Message from Sandrea Hughs sent at 01/28/2023  4:41 PM EDT ----- Call patient :  Shaun Potter is looks ok for now, no immediate concerns but there are some changes indicating needs tight bp control and f/u by cards would be advised if not already established.    Also I would rec ONO on RA to be sure low oxygen at night is not contributing to the findings and plan on f/u with me a week p ONO is obtained    Copy to PCP   Pt aware ONO ordered. Pt states bp has been controlled with new medication.

## 2023-04-14 DIAGNOSIS — I129 Hypertensive chronic kidney disease with stage 1 through stage 4 chronic kidney disease, or unspecified chronic kidney disease: Secondary | ICD-10-CM | POA: Diagnosis not present

## 2023-04-14 DIAGNOSIS — Z125 Encounter for screening for malignant neoplasm of prostate: Secondary | ICD-10-CM | POA: Diagnosis not present

## 2023-04-23 DIAGNOSIS — M791 Myalgia, unspecified site: Secondary | ICD-10-CM | POA: Diagnosis not present

## 2023-04-23 DIAGNOSIS — Z23 Encounter for immunization: Secondary | ICD-10-CM | POA: Diagnosis not present

## 2023-04-23 DIAGNOSIS — E663 Overweight: Secondary | ICD-10-CM | POA: Diagnosis not present

## 2023-04-23 DIAGNOSIS — E1122 Type 2 diabetes mellitus with diabetic chronic kidney disease: Secondary | ICD-10-CM | POA: Diagnosis not present

## 2023-04-23 DIAGNOSIS — Z6829 Body mass index (BMI) 29.0-29.9, adult: Secondary | ICD-10-CM | POA: Diagnosis not present

## 2023-04-23 DIAGNOSIS — I129 Hypertensive chronic kidney disease with stage 1 through stage 4 chronic kidney disease, or unspecified chronic kidney disease: Secondary | ICD-10-CM | POA: Diagnosis not present

## 2023-04-23 DIAGNOSIS — G43019 Migraine without aura, intractable, without status migrainosus: Secondary | ICD-10-CM | POA: Diagnosis not present

## 2023-04-23 DIAGNOSIS — N184 Chronic kidney disease, stage 4 (severe): Secondary | ICD-10-CM | POA: Diagnosis not present

## 2023-04-23 DIAGNOSIS — I517 Cardiomegaly: Secondary | ICD-10-CM | POA: Diagnosis not present

## 2023-04-23 DIAGNOSIS — I281 Aneurysm of pulmonary artery: Secondary | ICD-10-CM | POA: Diagnosis not present

## 2023-04-23 DIAGNOSIS — I77810 Thoracic aortic ectasia: Secondary | ICD-10-CM | POA: Diagnosis not present

## 2023-04-23 DIAGNOSIS — E782 Mixed hyperlipidemia: Secondary | ICD-10-CM | POA: Diagnosis not present

## 2023-08-21 ENCOUNTER — Encounter: Payer: Self-pay | Admitting: Cardiology

## 2023-08-21 ENCOUNTER — Ambulatory Visit: Payer: PPO | Attending: Cardiology | Admitting: Cardiology

## 2023-08-21 VITALS — BP 114/64 | HR 65 | Ht 74.0 in | Wt 222.2 lb

## 2023-08-21 DIAGNOSIS — R0602 Shortness of breath: Secondary | ICD-10-CM | POA: Diagnosis not present

## 2023-08-21 DIAGNOSIS — I288 Other diseases of pulmonary vessels: Secondary | ICD-10-CM | POA: Diagnosis not present

## 2023-08-21 DIAGNOSIS — I272 Pulmonary hypertension, unspecified: Secondary | ICD-10-CM | POA: Diagnosis not present

## 2023-08-21 DIAGNOSIS — I712 Thoracic aortic aneurysm, without rupture, unspecified: Secondary | ICD-10-CM

## 2023-08-21 NOTE — Patient Instructions (Addendum)
 Medication Instructions:  Your physician recommends that you continue on your current medications as directed. Please refer to the Current Medication list given to you today.  *If you need a refill on your cardiac medications before your next appointment, please call your pharmacy*  Lab Work: BMET in June prior to CT  If you have labs (blood work) drawn today and your tests are completely normal, you will receive your results only by: MyChart Message (if you have MyChart) OR A paper copy in the mail If you have any lab test that is abnormal or we need to change your treatment, we will call you to review the results.  Testing/Procedures: Your physician has recommended that you have a pulmonary function test. Pulmonary Function Tests are a group of tests that measure how well air moves in and out of your lungs.  CT Angiography (CTA), is a special type of CT scan that uses a computer to produce multi-dimensional views of major blood vessels throughout the body. In CT angiography, a contrast material is injected through an IV to help visualize the blood vessels  VQ Scan- You must have a Chest X-Ray 24 hours prior to having VQ Scan.   Follow-Up: At [facility], you and your health needs are our priority.  As part of our continuing mission to provide you with exceptional heart care, our providers are all part of one team.  This team includes your primary Cardiologist (physician) and Advanced Practice Providers or APPs (Physician Assistants and Nurse Practitioners) who all work together to provide you with the care you need, when you need it.  Your next appointment:   4 month(s)  Provider:   You may see Dina Rich, MD or one of the following Advanced Practice Providers on your designated Care Team:   Randall An, PA-C  [place], [place] Jacolyn Reedy, [place]     We recommend signing up for the patient portal called "MyChart".  Sign up information is provided on this  After Visit Summary.  MyChart is used to connect with patients for Virtual Visits (Telemedicine).  Patients are able to view lab/test results, encounter notes, upcoming appointments, etc.  Non-urgent messages can be sent to your provider as well.   To learn more about what you can do with MyChart, go to [email or web address].   Other Instructions

## 2023-08-21 NOTE — Progress Notes (Signed)
 Clinical Summary Mr. Kapusta is a 68 y.o.male seen today as a new consult, referred by NP [facility] for the following medical problems   1.. Aortic aneurysm - 10/2022 coronary CT showed 4.2 cm ascending aorta   2. Dilated pulmonary artery/Pulmonary HTN Dilated main pulmonary artery measuring 32mm (normal men 29 mm)  01/2023 echo: LVEF 60-65%, no WMAs, grade I dd, D shaped septum consistent with pressure and volume overload. Normal RV function, mild RVE, normal PASP by echo  Echo reviewed. On my review mild Dshaped septum in systole, moderately enlarged RV with normal function. TR jet is inadequate to accurately assess PASP.  Grade I DD, normal LA pressure   - quit smoking in 1994, smoked only smoked for 4 years. 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  - no snoring, no daytime somnolence, no witnessed apneic episodes - no specific SOB/DOE though somewhat sedentary. No LE edema.       10/2022 coronary calcium score: 0  Past Medical History:  Diagnosis Date   Anxiety    Borderline hypertension 2012   Chest pain 2012   [facility] admission in 10/2010; associated with palpitations; negative Holter in 2007   History of kidney stones    Hypertension    Migraine headache    Nephrolithiasis 1992   s/p cystoscopic stone extraction   Sinus bradycardia 2012     No Known Allergies   Current Outpatient Medications  Medication Sig Dispense Refill   hydrochlorothiazide (HYDRODIURIL) 25 MG tablet One daily 30 tablet 11   olmesartan (BENICAR) 20 MG tablet Take 20 mg by mouth daily.     rizatriptan (MAXALT) 10 MG tablet Take 10 mg by mouth daily as needed for migraine.     No current facility-administered medications for this visit.     Past Surgical History:  Procedure Laterality Date   COLONOSCOPY  2006   COLONOSCOPY N/A 05/04/2015   Procedure: COLONOSCOPY;  Surgeon: Corbin Ade, MD;  Location: [facility];  Service: Endoscopy;   Laterality: N/A;  0730   EAR CYST EXCISION Right 11/10/2012   Procedure: EXCISION RIGHT THIGH CYST;  Surgeon: Fabio Bering, MD;  Location: [facility] OR[facility];  Service: General;  Laterality: Right;   URETEROSCOPY  2004   + stone extraction     No Known Allergies    Family History  Problem Relation Age of Onset   Arrhythmia Father    Coronary artery disease Neg Hx      Social History Mr. Pound reports that he quit smoking about 30 years ago. His smoking use included cigarettes. He started smoking about 36 years ago. He has a 9 pack-year smoking history. He has quit using smokeless tobacco. Mr. Masoud reports no history of alcohol use.     Physical Examination Today's Vitals   08/21/23 1037  BP: 114/64  Pulse: 65  SpO2: 95%  Weight: 222 lb 3.2 oz (100.8 kg)  Height: 6\' 2"  (1.88 m)  PainSc: 0-No pain   Body mass index is 28.53 kg/m.  Gen: resting comfortably, no acute distress HEENT: no scleral icterus, pupils equal round and reactive, no palptable cervical adenopathy,  CV: RRR, no m/rg no jvd Resp: Clear to auscultation bilaterally GI: abdomen is soft, non-tender, non-distended, normal bowel sounds, no hepatosplenomegaly MSK: extremities are warm, no edema.  Skin: warm, no rash Neuro:  no focal deficits Psych: appropriate affect    Assessment and Plan  1.Aortic aneurusm - mild  by CT last year, needs CTA 10/2023 to reevaluate  2.Dilated pulmonary artery/Pulmonary hypertension - dilated pulmonary artery on CT - his echo shows some mild ventricular septal flattening in systole suggesting RV pressure overload. RV is moderately enlarged though has normal function. PASP on my review not able to be measured by echo, the spectral envelope is not complete to be able to accurately measure TR velocity. - evidence suggesting pulmonary HTN.  - fairly benign left heart findings on echo with just grade I dd and normal LA pressures - check PFTs, VQ scan to start work up. If  bening would likely repeat echo over next few months, if progressive changes may need to consider further workup including possible RHC - he denies signs of symptoms to suggest OSA, hold on sleep eval at this time.         Antoine Poche, M.D.

## 2023-08-26 ENCOUNTER — Ambulatory Visit (HOSPITAL_COMMUNITY)

## 2023-09-01 ENCOUNTER — Encounter (HOSPITAL_COMMUNITY): Payer: Self-pay

## 2023-09-01 ENCOUNTER — Ambulatory Visit (HOSPITAL_COMMUNITY)
Admission: RE | Admit: 2023-09-01 | Discharge: 2023-09-01 | Disposition: A | Discharge status: Home / Self Care | Source: Ambulatory Visit | Attending: Cardiology | Admitting: Cardiology

## 2023-09-01 DIAGNOSIS — I272 Pulmonary hypertension, unspecified: Secondary | ICD-10-CM | POA: Diagnosis not present

## 2023-09-01 DIAGNOSIS — R0602 Shortness of breath: Secondary | ICD-10-CM | POA: Diagnosis not present

## 2023-09-01 MED ORDER — TECHNETIUM TO 99M ALBUMIN AGGREGATED
4.0000 | Freq: Once | INTRAVENOUS | Status: AC | PRN
  Administered 2023-09-01: 4 via INTRAVENOUS

## 2023-10-12 ENCOUNTER — Ambulatory Visit (HOSPITAL_COMMUNITY)
Admission: RE | Admit: 2023-10-12 | Discharge: 2023-10-12 | Disposition: A | Discharge status: Home / Self Care | Source: Ambulatory Visit | Attending: Cardiology | Admitting: Cardiology

## 2023-10-12 DIAGNOSIS — I712 Thoracic aortic aneurysm, without rupture, unspecified: Secondary | ICD-10-CM | POA: Diagnosis not present

## 2023-10-12 DIAGNOSIS — E041 Nontoxic single thyroid nodule: Secondary | ICD-10-CM | POA: Diagnosis not present

## 2023-10-12 MED ORDER — IOHEXOL 350 MG/ML SOLN
75.0000 mL | Freq: Once | INTRAVENOUS | Status: AC | PRN
  Administered 2023-10-12: 75 mL via INTRAVENOUS

## 2023-10-15 ENCOUNTER — Ambulatory Visit: Payer: Self-pay | Admitting: Cardiology

## 2023-10-16 DIAGNOSIS — Z125 Encounter for screening for malignant neoplasm of prostate: Secondary | ICD-10-CM | POA: Diagnosis not present

## 2023-10-16 DIAGNOSIS — I129 Hypertensive chronic kidney disease with stage 1 through stage 4 chronic kidney disease, or unspecified chronic kidney disease: Secondary | ICD-10-CM | POA: Diagnosis not present

## 2023-10-22 DIAGNOSIS — N184 Chronic kidney disease, stage 4 (severe): Secondary | ICD-10-CM | POA: Diagnosis not present

## 2023-10-22 DIAGNOSIS — N1831 Chronic kidney disease, stage 3a: Secondary | ICD-10-CM | POA: Diagnosis not present

## 2023-10-22 DIAGNOSIS — I129 Hypertensive chronic kidney disease with stage 1 through stage 4 chronic kidney disease, or unspecified chronic kidney disease: Secondary | ICD-10-CM | POA: Diagnosis not present

## 2023-10-22 DIAGNOSIS — R7301 Impaired fasting glucose: Secondary | ICD-10-CM | POA: Diagnosis not present

## 2023-10-22 DIAGNOSIS — E782 Mixed hyperlipidemia: Secondary | ICD-10-CM | POA: Diagnosis not present

## 2023-10-22 DIAGNOSIS — I281 Aneurysm of pulmonary artery: Secondary | ICD-10-CM | POA: Diagnosis not present

## 2023-10-22 DIAGNOSIS — M791 Myalgia, unspecified site: Secondary | ICD-10-CM | POA: Diagnosis not present

## 2023-10-22 DIAGNOSIS — Z125 Encounter for screening for malignant neoplasm of prostate: Secondary | ICD-10-CM | POA: Diagnosis not present

## 2023-10-22 DIAGNOSIS — G43019 Migraine without aura, intractable, without status migrainosus: Secondary | ICD-10-CM | POA: Diagnosis not present

## 2023-10-22 DIAGNOSIS — I77810 Thoracic aortic ectasia: Secondary | ICD-10-CM | POA: Diagnosis not present

## 2023-10-22 DIAGNOSIS — E1122 Type 2 diabetes mellitus with diabetic chronic kidney disease: Secondary | ICD-10-CM | POA: Diagnosis not present

## 2023-10-22 DIAGNOSIS — E663 Overweight: Secondary | ICD-10-CM | POA: Diagnosis not present

## 2023-11-07 ENCOUNTER — Other Ambulatory Visit: Payer: Self-pay | Admitting: Internal Medicine

## 2023-12-24 ENCOUNTER — Ambulatory Visit: Attending: Cardiology | Admitting: Cardiology

## 2023-12-24 ENCOUNTER — Encounter: Payer: Self-pay | Admitting: Cardiology

## 2023-12-24 VITALS — BP 130/86 | HR 60 | Ht 74.0 in | Wt 225.4 lb

## 2023-12-24 DIAGNOSIS — I712 Thoracic aortic aneurysm, without rupture, unspecified: Secondary | ICD-10-CM | POA: Diagnosis not present

## 2023-12-24 DIAGNOSIS — I272 Pulmonary hypertension, unspecified: Secondary | ICD-10-CM

## 2023-12-24 DIAGNOSIS — I288 Other diseases of pulmonary vessels: Secondary | ICD-10-CM

## 2023-12-24 NOTE — Progress Notes (Signed)
 Clinical Summary Mr. Loflin is a 68 y.o.male seen today for follow up of the following medical problems.    1.Aortic aneurysm - 10/2022 coronary CT showed 4.2 cm ascending aorta  10/2023 CTA: ascending 3.9 cm.    2. Dilated pulmonary artery/Pulmonary HTN Dilated main pulmonary artery measuring 32mm (normal men 29 mm)   01/2023 echo: LVEF 60-65%, no WMAs, grade I dd, D shaped septum consistent with pressure and volume overload. Normal RV function, mild RVE, normal PASP by echo   Echo reviewed. On my review mild Dshaped septum in systole, moderately enlarged RV with normal function. TR jet is inadequate to accurately assess PASP.  Grade I DD, normal LA pressure     - quit smoking in 1994, smoked only smoked for 4 years. 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  - no snoring, no daytime somnolence, no witnessed apneic episodes - no specific SOB/DOE though somewhat sedentary. No LE edema.    - 08/2023 VQ scan: negative - PFTS: pending - 10/2023 CTA main pulmonary artery normal - no recent SOB/DOE.      10/2022 coronary calcium score: 0 Past Medical History:  Diagnosis Date   Anxiety    Borderline hypertension 2012   Chest pain 2012   [facility] admission in 10/2010; associated with palpitations; negative Holter in 2007   History of kidney stones    Hypertension    Migraine headache    Nephrolithiasis 1992   s/p cystoscopic stone extraction   Sinus bradycardia 2012     No Known Allergies   Current Outpatient Medications  Medication Sig Dispense Refill   diclofenac (VOLTAREN) 75 MG EC tablet Take 1 tablet by mouth 2 (two) times daily.     hydrochlorothiazide  (HYDRODIURIL ) 25 MG tablet TAKE 1 TABLET BY MOUTH DAILY 90 tablet 3   nystatin ointment (MYCOSTATIN) Apply 1 Application topically 2 (two) times daily as needed.     olmesartan (BENICAR) 20 MG tablet Take 20 mg by mouth daily.     rizatriptan (MAXALT) 10 MG tablet Take 10 mg by  mouth daily as needed for migraine.     No current facility-administered medications for this visit.     Past Surgical History:  Procedure Laterality Date   COLONOSCOPY  2006   COLONOSCOPY N/A 05/04/2015   Procedure: COLONOSCOPY;  Surgeon: Lamar CHRISTELLA Hollingshead, MD;  Location: [facility];  Service: Endoscopy;  Laterality: N/A;  0730   EAR CYST EXCISION Right 11/10/2012   Procedure: EXCISION RIGHT THIGH CYST;  Surgeon: Thresa JAYSON Pulling, MD;  Location: [facility];  Service: General;  Laterality: Right;   URETEROSCOPY  2004   + stone extraction     No Known Allergies    Family History  Problem Relation Age of Onset   Arrhythmia Father    Coronary artery disease Neg Hx      Social History Mr. Brafford reports that he quit smoking about 31 years ago. His smoking use included cigarettes. He started smoking about 37 years ago. He has a 9 pack-year smoking history. He has quit using smokeless tobacco. Mr. Cookston reports no history of alcohol use.    Physical Examination Today's Vitals   12/24/23 0828  BP: 130/86  Pulse: 60  SpO2: 99%  Weight: 225 lb 6.4 oz (102.2 kg)  Height: 6' 2 (1.88 m)  PainSc: 0-No pain   Body mass index is 28.94 kg/m.  Gen: resting comfortably, no acute distress HEENT:  no scleral icterus, pupils equal round and reactive, no palptable cervical adenopathy,  CV: RRR, no mrg, no jvd Resp: Clear to auscultation bilaterally GI: abdomen is soft, non-tender, non-distended, normal bowel sounds, no hepatosplenomegaly MSK: extremities are warm, no edema.  Skin: warm, no rash Neuro:  no focal deficits Psych: appropriate affect   Diagnostic Studies  08/2023 VQ scan IMPRESSION: No evidence acute pulmonary embolism.  08/2023 CXR MPRESSION: No active cardiopulmonary disease.     Assessment and Plan  1.Aortic aneurusm - mild and stable by recent imaging, continue to monitor. Repeat CTA next year   2.Dilated pulmonary artery/Pulmonary hypertension -  dilated pulmonary artery on CT though reported normal on most recent scan - his echo shows some mild ventricular septal flattening in systole suggesting RV pressure overload. RV is moderately enlarged though has normal function. PASP on my review not able to be measured by echo, the spectral envelope is not complete to be able to accurately measure TR velocity. - evidence suggesting pulmonary HTN.  - fairly benign left heart findings on echo with just grade I dd and normal LA pressures - VQ and chest xray benign. PFTs are pending. He denies signs or symptoms of OSA - repeat echo, pending findings may consider referral to HF clinic.        Dorn PHEBE Ross, M.D.

## 2023-12-24 NOTE — Patient Instructions (Signed)
 Medication Instructions:   Continue all current medications.   Labwork:  none  Testing/Procedures:  Your physician has requested that you have an echocardiogram. Echocardiography is a painless test that uses sound waves to create images of your heart. It provides your doctor with information about the size and shape of your heart and how well your heart's chambers and valves are working. This procedure takes approximately one hour. There are no restrictions for this procedure. Please do NOT wear cologne, perfume, aftershave, or lotions (deodorant is allowed). Please arrive 15 minutes prior to your appointment time.  Please note: We ask at that you not bring children with you during ultrasound (echo/ vascular) testing. Due to room size and safety concerns, children are not allowed in the ultrasound rooms during exams. Our front office staff cannot provide observation of children in our lobby area while testing is being conducted. An adult accompanying a patient to their appointment will only be allowed in the ultrasound room at the discretion of the ultrasound technician under special circumstances. We apologize for any inconvenience. Your physician has recommended that you have a pulmonary function test. Pulmonary Function Tests are a group of tests that measure how well air moves in and out of your lungs.  Office will contact with results via phone, letter or mychart.     Follow-Up:  6 months   Any Other Special Instructions Will Be Listed Below (If Applicable).   If you need a refill on your cardiac medications before your next appointment, please call your pharmacy.

## 2023-12-31 ENCOUNTER — Ambulatory Visit (HOSPITAL_COMMUNITY): Admission: RE | Admit: 2023-12-31 | Source: Ambulatory Visit

## 2024-02-08 ENCOUNTER — Ambulatory Visit (HOSPITAL_COMMUNITY)
Admission: RE | Admit: 2024-02-08 | Discharge: 2024-02-08 | Disposition: A | Discharge status: Home / Self Care | Source: Ambulatory Visit | Attending: Internal Medicine | Admitting: Internal Medicine

## 2024-02-08 DIAGNOSIS — I272 Pulmonary hypertension, unspecified: Secondary | ICD-10-CM | POA: Diagnosis not present

## 2024-02-08 LAB — ECHOCARDIOGRAM COMPLETE
Area-P 1/2: 2.22 cm2
S' Lateral: 3 cm

## 2024-02-08 NOTE — Progress Notes (Signed)
*  PRELIMINARY RESULTS* Echocardiogram 2D Echocardiogram has been performed.  Shaun Potter 02/08/2024, 10:24 AM

## 2024-04-05 ENCOUNTER — Ambulatory Visit (HOSPITAL_COMMUNITY): Admission: RE | Admit: 2024-04-05 | Source: Ambulatory Visit

## 2024-04-18 DIAGNOSIS — I129 Hypertensive chronic kidney disease with stage 1 through stage 4 chronic kidney disease, or unspecified chronic kidney disease: Secondary | ICD-10-CM | POA: Diagnosis not present

## 2024-04-28 ENCOUNTER — Ambulatory Visit: Payer: Self-pay | Admitting: Cardiology

## 2024-05-03 ENCOUNTER — Encounter: Payer: Self-pay | Admitting: *Deleted
